# Patient Record
Sex: Male | Born: 1990 | Race: Black or African American | Hispanic: No | Marital: Single | Smoking: Never smoker
Health system: Southern US, Community
[De-identification: ages and names within clinical notes are randomized; demographics above are authoritative.]

## PROBLEM LIST (undated history)

## (undated) DIAGNOSIS — F909 Attention-deficit hyperactivity disorder, unspecified type: Secondary | ICD-10-CM

## (undated) DIAGNOSIS — M1990 Unspecified osteoarthritis, unspecified site: Secondary | ICD-10-CM

## (undated) HISTORY — PX: TIBIA FRACTURE SURGERY: SHX806

---
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
## 2010-02-09 ENCOUNTER — Emergency Department (HOSPITAL_COMMUNITY): Admission: EM | Admit: 2010-02-09 | Discharge: 2010-02-10 | Payer: Self-pay | Admitting: Emergency Medicine

## 2010-10-22 LAB — COMPREHENSIVE METABOLIC PANEL WITH GFR
ALT: 21 U/L (ref 0–53)
AST: 21 U/L (ref 0–37)
Albumin: 4.2 g/dL (ref 3.5–5.2)
Alkaline Phosphatase: 71 U/L (ref 39–117)
BUN: 11 mg/dL (ref 6–23)
CO2: 25 meq/L (ref 19–32)
Calcium: 9.5 mg/dL (ref 8.4–10.5)
Chloride: 100 meq/L (ref 96–112)
Creatinine, Ser: 1.28 mg/dL (ref 0.4–1.5)
GFR calc non Af Amer: >60 mL/min
Glucose, Bld: 117 mg/dL — ABNORMAL HIGH (ref 70–99)
Potassium: 3.3 meq/L — ABNORMAL LOW (ref 3.5–5.1)
Sodium: 136 meq/L (ref 135–145)
Total Bilirubin: 0.8 mg/dL (ref 0.3–1.2)
Total Protein: 7.6 g/dL (ref 6.0–8.3)

## 2010-10-22 LAB — DIFFERENTIAL
Basophils Absolute: 0 10*3/uL (ref 0.0–0.1)
Basophils Relative: 0 % (ref 0–1)
Eosinophils Absolute: 0 10*3/uL (ref 0.0–0.7)
Lymphocytes Relative: 3 % — ABNORMAL LOW (ref 12–46)
Lymphs Abs: 0.3 10*3/uL — ABNORMAL LOW (ref 0.7–4.0)
Neutro Abs: 8.8 10*3/uL — ABNORMAL HIGH (ref 1.7–7.7)
Neutrophils Relative %: 90 % — ABNORMAL HIGH (ref 43–77)

## 2010-10-22 LAB — CBC
HCT: 44.5 % (ref 39.0–52.0)
Hemoglobin: 15 g/dL (ref 13.0–17.0)
MCHC: 33.7 g/dL (ref 30.0–36.0)
MCV: 89.8 fL (ref 78.0–100.0)
Platelets: 200 K/uL (ref 150–400)
RBC: 4.96 MIL/uL (ref 4.22–5.81)
RDW: 12.9 % (ref 11.5–15.5)
WBC: 9.8 K/uL (ref 4.0–10.5)

## 2010-10-22 LAB — SAMPLE TO BLOOD BANK

## 2010-10-22 LAB — PROTIME-INR
INR: 1.05 (ref 0.00–1.49)
Prothrombin Time: 13.6 s (ref 11.6–15.2)

## 2010-10-22 LAB — APTT: aPTT: 30 s (ref 24–37)
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
## 2012-02-01 ENCOUNTER — Emergency Department (HOSPITAL_COMMUNITY)
Admission: EM | Admit: 2012-02-01 | Discharge: 2012-02-01 | Disposition: A | Discharge status: Home / Self Care | Payer: Self-pay

## 2012-02-01 NOTE — ED Notes (Signed)
Patient left without being seen.

## 2012-05-23 ENCOUNTER — Emergency Department (HOSPITAL_COMMUNITY)
Admission: EM | Admit: 2012-05-23 | Discharge: 2012-05-23 | Disposition: A | Discharge status: Home / Self Care | Payer: Self-pay | Attending: Emergency Medicine | Admitting: Emergency Medicine

## 2012-05-23 ENCOUNTER — Encounter (HOSPITAL_COMMUNITY): Payer: Self-pay

## 2012-05-23 DIAGNOSIS — Z760 Encounter for issue of repeat prescription: Secondary | ICD-10-CM | POA: Insufficient documentation

## 2012-05-23 DIAGNOSIS — M79609 Pain in unspecified limb: Secondary | ICD-10-CM | POA: Insufficient documentation

## 2012-05-23 DIAGNOSIS — M79606 Pain in leg, unspecified: Secondary | ICD-10-CM

## 2012-05-23 DIAGNOSIS — Z9889 Other specified postprocedural states: Secondary | ICD-10-CM | POA: Insufficient documentation

## 2012-05-23 DIAGNOSIS — M069 Rheumatoid arthritis, unspecified: Secondary | ICD-10-CM | POA: Insufficient documentation

## 2012-05-23 DIAGNOSIS — Z79899 Other long term (current) drug therapy: Secondary | ICD-10-CM | POA: Insufficient documentation

## 2012-05-23 HISTORY — DX: Unspecified osteoarthritis, unspecified site: M19.90

## 2012-05-23 MED ORDER — MELOXICAM 15 MG PO TABS: 15.0000 mg | ORAL_TABLET | Freq: Every day | ORAL | Status: DC

## 2012-05-23 MED ORDER — HYDROCODONE-ACETAMINOPHEN 5-300 MG PO TABS: 1.0000 | ORAL_TABLET | ORAL | Status: DC | PRN

## 2012-05-23 NOTE — ED Notes (Signed)
Pt presents with NAD-  Pt reports he is on vicodin for chronic pain- pt states he has been without med x 1 year.   Pt has RA has been taking other's RX for pain

## 2012-05-25 NOTE — ED Provider Notes (Signed)
Medical screening examination/treatment/procedure(s) were performed by non-physician practitioner and as supervising physician I was immediately available for consultation/collaboration.  Flint Melter, MD 05/25/12 [phone]
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
## 2012-11-29 ENCOUNTER — Emergency Department (HOSPITAL_COMMUNITY)
Admission: EM | Admit: 2012-11-29 | Discharge: 2012-11-29 | Disposition: A | Discharge status: Home / Self Care | Payer: Self-pay | Attending: Emergency Medicine | Admitting: Emergency Medicine

## 2012-11-29 ENCOUNTER — Encounter (HOSPITAL_COMMUNITY): Payer: Self-pay | Admitting: Emergency Medicine

## 2012-11-29 ENCOUNTER — Emergency Department (HOSPITAL_COMMUNITY): Payer: Self-pay

## 2012-11-29 DIAGNOSIS — S99929A Unspecified injury of unspecified foot, initial encounter: Secondary | ICD-10-CM | POA: Insufficient documentation

## 2012-11-29 DIAGNOSIS — Z8739 Personal history of other diseases of the musculoskeletal system and connective tissue: Secondary | ICD-10-CM | POA: Insufficient documentation

## 2012-11-29 DIAGNOSIS — S8992XA Unspecified injury of left lower leg, initial encounter: Secondary | ICD-10-CM

## 2012-11-29 DIAGNOSIS — S8990XA Unspecified injury of unspecified lower leg, initial encounter: Secondary | ICD-10-CM | POA: Insufficient documentation

## 2012-11-29 HISTORY — DX: Attention-deficit hyperactivity disorder, unspecified type: F90.9

## 2012-11-29 MED ORDER — HYDROCODONE-ACETAMINOPHEN 5-325 MG PO TABS: 1.0000 | ORAL_TABLET | Freq: Four times a day (QID) | ORAL | Status: DC | PRN

## 2012-11-29 MED ORDER — IBUPROFEN 800 MG PO TABS: 800.0000 mg | ORAL_TABLET | Freq: Three times a day (TID) | ORAL | Status: DC | PRN
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
## 2012-11-29 NOTE — ED Provider Notes (Signed)
Medical screening examination/treatment/procedure(s) were performed by non-physician practitioner and as supervising physician I was immediately available for consultation/collaboration.   Jaryah Aracena Y. Landen Knoedler, MD 11/29/12 2338 

## 2012-12-20 ENCOUNTER — Emergency Department (HOSPITAL_COMMUNITY)
Admission: EM | Admit: 2012-12-20 | Discharge: 2012-12-20 | Disposition: A | Discharge status: Home / Self Care | Payer: Self-pay | Attending: Emergency Medicine | Admitting: Emergency Medicine

## 2012-12-20 ENCOUNTER — Encounter (HOSPITAL_COMMUNITY): Payer: Self-pay | Admitting: *Deleted

## 2012-12-20 DIAGNOSIS — Y929 Unspecified place or not applicable: Secondary | ICD-10-CM | POA: Insufficient documentation

## 2012-12-20 DIAGNOSIS — M62838 Other muscle spasm: Secondary | ICD-10-CM | POA: Insufficient documentation

## 2012-12-20 DIAGNOSIS — M129 Arthropathy, unspecified: Secondary | ICD-10-CM | POA: Insufficient documentation

## 2012-12-20 DIAGNOSIS — W11XXXA Fall on and from ladder, initial encounter: Secondary | ICD-10-CM | POA: Insufficient documentation

## 2012-12-20 DIAGNOSIS — F909 Attention-deficit hyperactivity disorder, unspecified type: Secondary | ICD-10-CM | POA: Insufficient documentation

## 2012-12-20 DIAGNOSIS — Y990 Civilian activity done for income or pay: Secondary | ICD-10-CM | POA: Insufficient documentation

## 2012-12-20 DIAGNOSIS — W19XXXA Unspecified fall, initial encounter: Secondary | ICD-10-CM

## 2012-12-20 DIAGNOSIS — M62830 Muscle spasm of back: Secondary | ICD-10-CM

## 2012-12-20 MED ORDER — DIAZEPAM 5 MG PO TABS
5.0000 mg | ORAL_TABLET | Freq: Once | ORAL | Status: AC
  Administered 2012-12-20: 5 mg via ORAL
  Filled 2012-12-20: qty 1

## 2012-12-20 MED ORDER — TRAMADOL HCL 50 MG PO TABS
50.0000 mg | ORAL_TABLET | Freq: Once | ORAL | Status: AC
  Administered 2012-12-20: 50 mg via ORAL
  Filled 2012-12-20: qty 1

## 2012-12-20 MED ORDER — DIAZEPAM 5 MG PO TABS: 5.0000 mg | ORAL_TABLET | Freq: Three times a day (TID) | ORAL | Status: DC | PRN

## 2012-12-20 MED ORDER — TRAMADOL HCL 50 MG PO TABS: 50.0000 mg | ORAL_TABLET | Freq: Once | ORAL | Status: DC

## 2012-12-20 NOTE — ED Notes (Signed)
Pt in c/o lower back pain, states he fell at work today and landed on his back, increased pain with movement. History of back pain in same area.

## 2012-12-20 NOTE — ED Provider Notes (Signed)
History     CSN: [id number]  Arrival date & time 12/20/12  0241   First MD Initiated Contact with Patient 12/20/12 0256      Chief Complaint  Patient presents with  . Back Pain    (Consider location/radiation/quality/duration/timing/severity/associated sxs/prior treatment) HPI Comments: Patient states he fell off a ladder today at work, approximately 8 PM, landing on his back.  Has not taken any medication.  Prior to arrival.  Denies nausea, vomiting, constipation, diarrhea, dysuria, hesitancy.  He did not hit his head  Patient is a 22 y.o. male presenting with back pain. The history is provided by the patient.  Back Pain Location:  Thoracic spine Quality:  Aching Radiates to:  Does not radiate Pain severity:  Moderate Pain is:  Same all the time Onset quality:  Sudden Duration:  6 hours Timing:  Constant Chronicity:  Recurrent Relieved by:  None tried Worsened by:  Movement Ineffective treatments:  None tried Associated symptoms: no abdominal pain, no abdominal swelling, no bladder incontinence, no bowel incontinence, no numbness and no weakness     Past Medical History  Diagnosis Date  . Arthritis   . ADHD (attention deficit hyperactivity disorder)     Past Surgical History  Procedure Laterality Date  . Tibia fracture surgery      Family History  Problem Relation Age of Onset  . Hypertension Mother     History  Substance Use Topics  . Smoking status: Never Smoker   . Smokeless tobacco: Not on file  . Alcohol Use: Yes      Review of Systems  Constitutional: Negative for activity change.  Gastrointestinal: Negative for nausea, abdominal pain and bowel incontinence.  Genitourinary: Negative for bladder incontinence.  Musculoskeletal: Positive for back pain.  Neurological: Negative for weakness and numbness.  All other systems reviewed and are negative.    Allergies  Aspirin  Home Medications   Current Outpatient Rx  Name  Route  Sig  Dispense   Refill  . diazepam (VALIUM) 5 MG tablet   Oral   Take 1 tablet (5 mg total) by mouth every 8 (eight) hours as needed for anxiety.   18 tablet   0   . traMADol (ULTRAM) 50 MG tablet   Oral   Take 1 tablet (50 mg total) by mouth once.   30 tablet   0     BP 142/83  Pulse 98  Temp(Src) 98.2 F (36.8 C) (Oral)  Resp 20  Ht 5\' 8"  (1.727 m)  Wt 185 lb (83.915 kg)  BMI 28.14 kg/m2  SpO2 100%  Physical Exam  Nursing note and vitals reviewed. Constitutional: He appears well-developed and well-nourished.  HENT:  Head: Normocephalic and atraumatic.  Eyes: Pupils are equal, round, and reactive to light.  Neck: Normal range of motion.  Cardiovascular: Normal rate and regular rhythm.   Pulmonary/Chest: Effort normal and breath sounds normal.  Musculoskeletal: He exhibits tenderness. He exhibits no edema.       Back:  Neurological: He is alert.    ED Course  Procedures (including critical care time)  Labs Reviewed - No data to display No results found.   1. Fall, initial encounter   2. Muscle spasm of back       MDM  I fell his is mostly muscle spasm will treat with Valium and Ultram and refer patient to his his occupational health physician         Arman Filter, NP 12/20/12 2002

## 2012-12-27 NOTE — ED Provider Notes (Signed)
Medical screening examination/treatment/procedure(s) were performed by non-physician practitioner and as supervising physician I was immediately available for consultation/collaboration.  Quade Ramirez R. Ahlam Piscitelli, MD 12/27/12 0721 

## 2013-02-18 ENCOUNTER — Emergency Department (HOSPITAL_COMMUNITY)
Admission: EM | Admit: 2013-02-18 | Discharge: 2013-02-18 | Disposition: A | Discharge status: Home / Self Care | Payer: Self-pay | Attending: Emergency Medicine | Admitting: Emergency Medicine

## 2013-02-18 ENCOUNTER — Encounter (HOSPITAL_COMMUNITY): Payer: Self-pay | Admitting: *Deleted

## 2013-02-18 DIAGNOSIS — R369 Urethral discharge, unspecified: Secondary | ICD-10-CM | POA: Insufficient documentation

## 2013-02-18 DIAGNOSIS — Z711 Person with feared health complaint in whom no diagnosis is made: Secondary | ICD-10-CM

## 2013-02-18 DIAGNOSIS — Z8739 Personal history of other diseases of the musculoskeletal system and connective tissue: Secondary | ICD-10-CM | POA: Insufficient documentation

## 2013-02-18 DIAGNOSIS — Z8659 Personal history of other mental and behavioral disorders: Secondary | ICD-10-CM | POA: Insufficient documentation

## 2013-02-18 DIAGNOSIS — Z202 Contact with and (suspected) exposure to infections with a predominantly sexual mode of transmission: Secondary | ICD-10-CM | POA: Insufficient documentation

## 2013-02-18 DIAGNOSIS — R3989 Other symptoms and signs involving the genitourinary system: Secondary | ICD-10-CM | POA: Insufficient documentation

## 2013-02-18 LAB — URINE MICROSCOPIC-ADD ON

## 2013-02-18 LAB — URINALYSIS, ROUTINE W REFLEX MICROSCOPIC
Bilirubin Urine: NEGATIVE
Protein, ur: NEGATIVE mg/dL
Specific Gravity, Urine: 1.02 (ref 1.005–1.030)
Urobilinogen, UA: 0.2 mg/dL (ref 0.0–1.0)

## 2013-02-18 MED ORDER — CEFTRIAXONE SODIUM 250 MG IJ SOLR
250.0000 mg | Freq: Once | INTRAMUSCULAR | Status: AC
  Administered 2013-02-18: 250 mg via INTRAMUSCULAR
  Filled 2013-02-18: qty 250

## 2013-02-18 MED ORDER — AZITHROMYCIN 250 MG PO TABS
1000.0000 mg | ORAL_TABLET | Freq: Once | ORAL | Status: AC
  Administered 2013-02-18: 1000 mg via ORAL
  Filled 2013-02-18: qty 4

## 2013-02-18 NOTE — ED Notes (Signed)
Pt states he's having yellow discharge from penis and burning w/ urination, pt denies odor, denies rash/redness.

## 2013-02-18 NOTE — ED Provider Notes (Signed)
Medical screening examination/treatment/procedure(s) were performed by non-physician practitioner and as supervising physician I was immediately available for consultation/collaboration.  Ethelda Chick, MD 02/18/13 [phone]

## 2013-02-18 NOTE — ED Provider Notes (Signed)
CSN: [id number]     Arrival date & time 02/18/13  1632 History     First MD Initiated Contact with Patient 02/18/13 1639     Chief Complaint  Patient presents with  . Penile Discharge  . burning w/ urination    (Consider location/radiation/quality/duration/timing/severity/associated sxs/prior Treatment) Patient is a 22 y.o. male presenting with penile discharge. The history is provided by the patient and medical records. No language interpreter was used.  Penile Discharge Pertinent negatives include no abdominal pain, chest pain, coughing, diaphoresis, fatigue, fever, headaches, nausea, rash or vomiting.    Anthony Michael is a 22 y.o. male  with a hx of arthritis for which he takes Norco presents to the Emergency Department complaining of gradual, persistent, progressively worsening penile discharge for 3 days with associated burning with urination.  Pt reports that the discharge is yellow in color and consistently dripping.  Pt states he is not currently sexually active and his last partner was a male and was 2 months ago.  Pt reports giving anal sex. Pt has not taken any OTC medications.  Nothing makes it better and nothing makes it worse.  Pt denies fever, chills, headache, neck pain, chest pain or shortness of breath, abdominal pain, nausea, vomiting, diarrhea and weakness, dizziness, syncope. He specifically denies penile lesions, penile pain, testicular pain, testicular swelling or hematuria.     Past Medical History  Diagnosis Date  . Arthritis   . ADHD (attention deficit hyperactivity disorder)    Past Surgical History  Procedure Laterality Date  . Tibia fracture surgery     Family History  Problem Relation Age of Onset  . Hypertension Mother    History  Substance Use Topics  . Smoking status: Never Smoker   . Smokeless tobacco: Never Used  . Alcohol Use: Yes    Review of Systems  Constitutional: Negative for fever, diaphoresis, appetite change, fatigue and unexpected  weight change.  HENT: Negative for mouth sores and neck stiffness.   Eyes: Negative for visual disturbance.  Respiratory: Negative for cough, chest tightness, shortness of breath and wheezing.   Cardiovascular: Negative for chest pain.  Gastrointestinal: Negative for nausea, vomiting, abdominal pain, diarrhea and constipation.  Endocrine: Negative for polydipsia, polyphagia and polyuria.  Genitourinary: Positive for discharge. Negative for dysuria, urgency, frequency and hematuria.  Musculoskeletal: Negative for back pain.  Skin: Negative for rash.  Allergic/Immunologic: Negative for immunocompromised state.  Neurological: Negative for syncope, light-headedness and headaches.  Hematological: Does not bruise/bleed easily.  Psychiatric/Behavioral: Negative for sleep disturbance. The patient is not nervous/anxious.     Allergies  Aspirin  Home Medications   Current Outpatient Rx  Name  Route  Sig  Dispense  Refill  . HYDROcodone-acetaminophen (NORCO/VICODIN) 5-325 MG per tablet   Oral   Take 1 tablet by mouth every 6 (six) hours as needed for pain.          BP 132/74  Pulse 85  Temp(Src) 98.1 F (36.7 C) (Oral)  Resp 20  SpO2 100% Physical Exam  Nursing note and vitals reviewed. Constitutional: He is oriented to person, place, and time. He appears well-developed and well-nourished. No distress.  Awake, alert, nontoxic appearance  HENT:  Head: Normocephalic and atraumatic.  Mouth/Throat: Oropharynx is clear and moist. No oropharyngeal exudate.  Eyes: Conjunctivae are normal. No scleral icterus.  Neck: Normal range of motion. Neck supple.  Cardiovascular: Normal rate, regular rhythm, normal heart sounds and intact distal pulses.   No murmur heard. Pulmonary/Chest: Effort normal and  breath sounds normal. No respiratory distress. He has no wheezes.  Abdominal: Soft. Bowel sounds are normal. He exhibits no mass. There is no tenderness. There is no rebound and no guarding.  Hernia confirmed negative in the right inguinal area and confirmed negative in the left inguinal area.  Genitourinary: Testes normal. Right testis shows no mass, no swelling and no tenderness. Right testis is descended. Left testis shows no mass, no swelling and no tenderness. Left testis is descended. Circumcised. No phimosis, paraphimosis, hypospadias, penile erythema or penile tenderness. Discharge (thick, yellow, copious and dripping) found.  No lesions or chancre  Musculoskeletal: Normal range of motion. He exhibits no edema.  Lymphadenopathy:       Right: No inguinal adenopathy present.       Left: No inguinal adenopathy present.  Neurological: He is alert and oriented to person, place, and time. He exhibits normal muscle tone. Coordination normal.  Speech is clear and goal oriented Moves extremities without ataxia  Skin: Skin is warm and dry. He is not diaphoretic. No erythema.  Psychiatric: He has a normal mood and affect.    ED Course   Procedures (including critical care time)  Labs Reviewed  URINALYSIS, ROUTINE W REFLEX MICROSCOPIC - Abnormal; Notable for the following:    APPearance CLOUDY (*)    Leukocytes, UA LARGE (*)    All other components within normal limits  GC/CHLAMYDIA PROBE AMP  URINE CULTURE  URINE MICROSCOPIC-ADD ON   No results found. 1. Concern about STD in male without diagnosis   2. Penile discharge     MDM  Anthony Michael  presents for STD screen and penile discharge.  STD cultures obtained including gonorrhea Chlamydia. UA with large leukocytes and WBC's TNTC but only rare bacteria; more consistent with STD than UTI. Patient treated here in the emergency department with Rocephin and azithromycin. Patient to be discharged with instructions to follow up with PCP. Discussed importance of using protection when sexually active. Pt understands that they have GC/Chlamydia cultures pending and that they will need to inform all sexual partners if results return  positive. I have also discussed reasons to return immediately to the ER. Patient expresses understanding and agrees with plan.    Dahlia Client Vernis Cabacungan, PA-C 02/18/13 1807

## 2013-02-19 LAB — URINE CULTURE: Culture: NO GROWTH

## 2013-11-07 ENCOUNTER — Encounter (HOSPITAL_COMMUNITY): Payer: Self-pay | Admitting: Emergency Medicine

## 2013-11-07 ENCOUNTER — Emergency Department (HOSPITAL_COMMUNITY)
Admission: EM | Admit: 2013-11-07 | Discharge: 2013-11-08 | Discharge status: Against Medical Advice | Payer: Self-pay | Attending: Emergency Medicine | Admitting: Emergency Medicine

## 2013-11-07 DIAGNOSIS — M79609 Pain in unspecified limb: Secondary | ICD-10-CM | POA: Insufficient documentation
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
## 2013-11-08 NOTE — ED Notes (Signed)
No answer when pt's name called in the waiting room or the area immediately outside; unable to locate pt.

## 2013-11-08 NOTE — ED Notes (Signed)
No answer when pt's name called in waiting room x 3

## 2013-11-08 NOTE — ED Notes (Signed)
No answered when pt's name called in waiting room and immediate area outside; unable to locate pt.

## 2013-11-15 ENCOUNTER — Encounter (HOSPITAL_COMMUNITY): Payer: Self-pay | Admitting: Emergency Medicine

## 2013-11-15 ENCOUNTER — Emergency Department (HOSPITAL_COMMUNITY)
Admission: EM | Admit: 2013-11-15 | Discharge: 2013-11-15 | Disposition: A | Discharge status: Home / Self Care | Payer: Self-pay | Attending: Emergency Medicine | Admitting: Emergency Medicine

## 2013-11-15 DIAGNOSIS — Z79899 Other long term (current) drug therapy: Secondary | ICD-10-CM | POA: Insufficient documentation

## 2013-11-15 DIAGNOSIS — M129 Arthropathy, unspecified: Secondary | ICD-10-CM | POA: Insufficient documentation

## 2013-11-15 DIAGNOSIS — Z8659 Personal history of other mental and behavioral disorders: Secondary | ICD-10-CM | POA: Insufficient documentation

## 2013-11-15 DIAGNOSIS — B353 Tinea pedis: Secondary | ICD-10-CM | POA: Insufficient documentation

## 2013-11-15 MED ORDER — TERBINAFINE HCL 1 % EX CREA: 1.0000 "application " | TOPICAL_CREAM | Freq: Two times a day (BID) | CUTANEOUS | Status: AC

## 2013-11-15 NOTE — ED Provider Notes (Signed)
CSN: [id number]     Arrival date & time 11/15/13  0111 History   First MD Initiated Contact with Patient 11/15/13 0217     Chief Complaint  Patient presents with  . Foot Pain     (Consider location/radiation/quality/duration/timing/severity/associated sxs/prior Treatment) HPI  23 year old male with history of arthritis and ADHD who presents complaining of left foot pain. Patient reports a week ago he went to have a pedicure in the next day he notice white skin changes in between the toes of his left foot. Report itching and burning sensation in between his toes and symptom is getting progressively worse. He tried taking Advil and try scratching at it with minimal relief. He has never had this problem before. He denies fever, chills, joint pain, or other rash. He denies history of HIV. He has no other complaints.  Past Medical History  Diagnosis Date  . Arthritis   . ADHD (attention deficit hyperactivity disorder)    Past Surgical History  Procedure Laterality Date  . Tibia fracture surgery     Family History  Problem Relation Age of Onset  . Hypertension Mother    History  Substance Use Topics  . Smoking status: Never Smoker   . Smokeless tobacco: Never Used  . Alcohol Use: Yes     Comment: occ    Review of Systems  Constitutional: Negative for fever.  Skin: Positive for rash.      Allergies  Aspirin  Home Medications   Prior to Admission medications   Medication Sig Start Date End Date Taking? Authorizing Provider  HYDROcodone-acetaminophen (NORCO/VICODIN) 5-325 MG per tablet Take 1 tablet by mouth every 6 (six) hours as needed for pain.    Historical Provider, MD   BP 126/69  Pulse 66  Temp(Src) 98.1 F (36.7 C) (Oral)  Resp 18  Ht 5\' 8"  (1.727 m)  SpO2 100% Physical Exam  Constitutional: He appears well-developed and well-nourished. No distress.  HENT:  Head: Atraumatic.  Eyes: Conjunctivae are normal.  Neck: Normal range of motion. Neck supple.   Neurological: He is alert.  Skin:  L foot: evidence of tinea pedis (macerated skin in between web of toes).  No signs of cellulitis.  No joint involvement.  Dorsalis pedis palpable.  No focal point tenderness to L foot or L ankle    Psychiatric: He has a normal mood and affect.    ED Course  Procedures (including critical care time)  2:32 AM Pt with evidence of tinea pedis.  Recommend Lamisil and good foot hygiene.    Labs Review Labs Reviewed - No data to display  Imaging Review No results found.   EKG Interpretation None      MDM   Final diagnoses:  Tinea pedis    BP 126/69  Pulse 66  Temp(Src) 98.1 F (36.7 C) (Oral)  Resp 18  Ht 5\' 8"  (1.727 m)  SpO2 100%     Domenic Moras, PA-C 11/15/13 0235

## 2013-11-15 NOTE — ED Notes (Signed)
Per pt report: pt c/o of left foot pain that began last week.  Pt reports white fungus like stuff in between toes.  Pt reports taking advil earlier today without relief.  Pt able to bear weight on foot. Pt a/o x 4.  Skiw warm and dry.

## 2013-11-15 NOTE — ED Notes (Signed)
Patient with white, yeast like area to left foot/toes. Patient ambulatory without difficulty.

## 2013-11-15 NOTE — Discharge Instructions (Signed)
Athlete's Foot Tinea Pedis, more commonly know as athlete's foot, is a contagious fungal skin infection of the feet. Athlete's foot usually affects the skin between the fourth and fifth toes. It is called athlete's foot because it is a common occurrence in athletes. SYMPTOMS   Scales on the feet, most commonly between the toes, that appear moist, soft, gray-white, or red.  Dead skin between the toes.  Itching in the inflamed areas.  Damp, musty foot odor.  Sometimes, small blisters on the feet, caused by a hypersensitivity to the fungus. CAUSES  Infection is caused by contact with a fungus or yeast. The fungus or yeast typically reside in moist socks and shoes, because the fungus thrives in dark, moist environments. RISK INCREASES WITH:  Walking barefoot public places such as bathrooms or showers  Poor hygiene of the feet: infrequent washing of the feet and/or infrequent changing of shoes or socks.  Hot, humid weather.  Forgetting to dry the spaces between your toes. PREVENTION  Follow good locker room hygiene.  Use your own towels.  Wear shoes or sandals.  Wash with warm or hot water and soap.  Wash your feet daily. Dry thoroughly, especially between the toes. Dust with talcum powder or antifungal powder.  Allow feet to dry out by occasionally walking barefoot.  Change socks daily.  Wear socks made of cotton, wool, or other natural absorbent fibers. Avoid socks made from synthetic fibers. PROGNOSIS  With appropriate treatment athlete's foot typically may resolve within 3 weeks, although, recurrence is common.  RELATED COMPLICATIONS   Chronic infection or recurrence, especially if not appropriately or completely treated.  Bacterial infection on top of the fungal infection in the affected area (bacterial superinfection or secondary bacterial infection).  Rarely, an allergic autoimmune response to the infection on the hands and face. TREATMENT Initially, keep the  affected area dry and cool. Remove the scaly and dead material if it is present. Change socks daily. Walking barefoot or wearing sandals whenever possible helps dry the affected area. Use antifungal powders, creams, or ointments after each bath. If the infection does not respond to topical treatment, contact your caregiver and he or she may prescribe oral antifungal medication. MEDICATION   Non-prescription antifungal creams, ointments, or powders can be used on your feet or toes or in shoes.  Anti-itch medications may be prescribed as necessary by your caregiver. Use only as directed and only as much as you need.  Oral antifungal medications may be prescribed by your caregiver. Take the entire course of medication as prescribed. SEEK MEDICAL CARE IF:   Symptoms get worse or do not improve in 2 weeks despite treatment.  New, unexplained symptoms develop (drugs used in treatment may produce side effects). Document Released: 07/07/2005 Document Revised: 09/29/2011 Document Reviewed: 10/19/2008 [facility] Patient Information 2014 [place], [place].

## 2013-11-15 NOTE — ED Provider Notes (Signed)
Medical screening examination/treatment/procedure(s) were performed by non-physician practitioner and as supervising physician I was immediately available for consultation/collaboration.    Victoriya Pol D Abigael Mogle, MD 11/15/13 0615 

## 2014-06-16 ENCOUNTER — Emergency Department (HOSPITAL_COMMUNITY)
Admission: EM | Admit: 2014-06-16 | Discharge: 2014-06-16 | Disposition: A | Discharge status: Home / Self Care | Payer: Self-pay | Attending: Emergency Medicine | Admitting: Emergency Medicine

## 2014-06-16 ENCOUNTER — Emergency Department (HOSPITAL_COMMUNITY): Payer: Self-pay

## 2014-06-16 ENCOUNTER — Encounter (HOSPITAL_COMMUNITY): Payer: Self-pay | Admitting: Emergency Medicine

## 2014-06-16 DIAGNOSIS — Z8781 Personal history of (healed) traumatic fracture: Secondary | ICD-10-CM | POA: Insufficient documentation

## 2014-06-16 DIAGNOSIS — Y92838 Other recreation area as the place of occurrence of the external cause: Secondary | ICD-10-CM | POA: Insufficient documentation

## 2014-06-16 DIAGNOSIS — X58XXXA Exposure to other specified factors, initial encounter: Secondary | ICD-10-CM | POA: Insufficient documentation

## 2014-06-16 DIAGNOSIS — S8992XA Unspecified injury of left lower leg, initial encounter: Secondary | ICD-10-CM | POA: Insufficient documentation

## 2014-06-16 DIAGNOSIS — Y9389 Activity, other specified: Secondary | ICD-10-CM | POA: Insufficient documentation

## 2014-06-16 DIAGNOSIS — Y998 Other external cause status: Secondary | ICD-10-CM | POA: Insufficient documentation

## 2014-06-16 DIAGNOSIS — Z8659 Personal history of other mental and behavioral disorders: Secondary | ICD-10-CM | POA: Insufficient documentation

## 2014-06-16 DIAGNOSIS — M25562 Pain in left knee: Secondary | ICD-10-CM

## 2014-06-16 DIAGNOSIS — Z880 Allergy status to penicillin: Secondary | ICD-10-CM | POA: Insufficient documentation

## 2014-06-16 MED ORDER — HYDROCODONE-ACETAMINOPHEN 5-325 MG PO TABS: ORAL_TABLET | ORAL | Status: DC

## 2014-06-16 MED ORDER — HYDROCODONE-ACETAMINOPHEN 5-325 MG PO TABS
1.0000 | ORAL_TABLET | Freq: Once | ORAL | Status: AC
  Administered 2014-06-16: 1 via ORAL
  Filled 2014-06-16: qty 1

## 2014-06-16 NOTE — ED Notes (Signed)
Pt was doing swats at gym today and felt knee and hip pop. Pt has not been able to walk on leg since then. Was assisted by friend. No deformity noted.

## 2014-06-16 NOTE — ED Notes (Signed)
Pt transported to radiology via wheelchair with [facility]

## 2014-06-16 NOTE — ED Provider Notes (Signed)
CSN: [id number]     Arrival date & time 06/16/14  1827 History  This chart was scribed for non-physician practitioner, Monico Blitz, PA-C,working with Quintella Reichert, MD, by Marlowe Kays, ED Scribe. This patient was seen in room WTR6/WTR6 and the patient's care was started at 7:03 PM.  Chief Complaint  Patient presents with  . Knee Pain  . Hip Pain   The history is provided by the patient. No language interpreter was used.    HPI Comments:  Anthony Michael is a 23 y.o. male with PMH of left tibia fracture and arthritis who presents to the Emergency Department complaining of gradually worsening left knee pain that began two days ago. He reports when he was at the gym earlier today doing squats he felt something "popping". He reports the pain radiates up into his left thigh and hip. He denies doing anything or taking anything for the pain. He states he cannot bear weight since feeling the popping. Denies modifying factors. Denies numbness, tingling or weakness of the leg, redness or warmth of the left knee. Allergy to ASA. PMH of ADHD.  Past Medical History  Diagnosis Date  . Arthritis   . ADHD (attention deficit hyperactivity disorder)    Past Surgical History  Procedure Laterality Date  . Tibia fracture surgery     Family History  Problem Relation Age of Onset  . Hypertension Mother    History  Substance Use Topics  . Smoking status: Never Smoker   . Smokeless tobacco: Never Used  . Alcohol Use: Yes     Comment: occ    Review of Systems  Musculoskeletal: Positive for arthralgias.  Skin: Negative for color change and wound.  Neurological: Negative for weakness and numbness.  All other systems reviewed and are negative.   Allergies  Aspirin and Penicillins  Home Medications   Prior to Admission medications   Medication Sig Start Date End Date Taking? Authorizing Provider  HYDROcodone-acetaminophen (NORCO/VICODIN) 5-325 MG per tablet Take 1-2 tablets by mouth every  6 hours as needed for pain. 06/16/14   Naziyah Tieszen, PA-C   Triage Vitals: BP 122/70 mmHg  Pulse 88  Temp(Src) 98.4 F (36.9 C) (Oral)  Resp 16  SpO2 98% Physical Exam  Constitutional: He is oriented to person, place, and time. He appears well-developed and well-nourished.  HENT:  Head: Normocephalic and atraumatic.  Eyes: EOM are normal.  Neck: Normal range of motion.  Cardiovascular: Normal rate.   Pulmonary/Chest: Effort normal.  Musculoskeletal: Normal range of motion. He exhibits tenderness.  Left knee:  No deformity, erythema or abrasions. Decreased range of motion. No effusion. Significant crepitance.Marland Kitchen Anterior and posterior drawer show no abnormal laxity. Stable to valgus and varus stress. Joint lines are non-tender. Neurovascularly intact.   Left hip with no tenderness to palpation, full active range of motion.  Neurological: He is alert and oriented to person, place, and time.  Skin: Skin is warm and dry.  Psychiatric: He has a normal mood and affect. His behavior is normal.  Nursing note and vitals reviewed.   ED Course  Procedures (including critical care time) DIAGNOSTIC STUDIES: Oxygen Saturation is 98% on RA, normal by my interpretation.   COORDINATION OF CARE: 7:07 PM- Will X-Ray left knee. Pt verbalizes understanding and agrees to plan.  Medications  HYDROcodone-acetaminophen (NORCO/VICODIN) 5-325 MG per tablet 1 tablet (1 tablet Oral Given 06/16/14 1916)    Labs Review Labs Reviewed - No data to display  Imaging Review Dg Knee Complete 4 Views  Left  06/16/2014   CLINICAL DATA:  Left knee pain/swelling  EXAM: LEFT KNEE - COMPLETE 4+ VIEW  COMPARISON:  None.  FINDINGS: No fracture or dislocation is seen.  Mild degenerative changes with sharpening of the tibial spines, lateral compartment narrowing, and medial compartment osteophytosis.  No definite suprapatellar knee joint effusion.  IMPRESSION: No fracture or dislocation is seen.  Mild degenerative  changes.   Electronically Signed   By: Julian Hy M.D.   On: 06/16/2014 20:12     EKG Interpretation None      MDM   Final diagnoses:  Arthralgia of left knee    Filed Vitals:   06/16/14 1911 06/16/14 2038  BP: 122/70 122/78  Pulse: 88 88  Temp: 98.4 F (36.9 C)   TempSrc: Oral   Resp: 16 16  SpO2: 98% 99%    Medications  HYDROcodone-acetaminophen (NORCO/VICODIN) 5-325 MG per tablet 1 tablet (1 tablet Oral Given 06/16/14 1916)    Anthony Michael is a 23 y.o. male presenting with atraumatic knee pain. Physical exam was significant crepitance. X-ray with no acute abnormalities. This appears to be a chronic issue for the patient who has a history of trauma to the left tib-fib. I have advised him to follow closely with orthopedics. Patient will be given crutches and Ace wrap.  Evaluation does not show pathology that would require ongoing emergent intervention or inpatient treatment. Pt is hemodynamically stable and mentating appropriately. Discussed findings and plan with patient/guardian, who agrees with care plan. All questions answered. Return precautions discussed and outpatient follow up given.    I personally performed the services described in this documentation, which was scribed in my presence. The recorded information has been reviewed and is accurate.    Monico Blitz, PA-C 06/17/14 [place], MD 06/17/14 [id number]

## 2014-06-16 NOTE — ED Notes (Signed)
Ortho Tech contacted

## 2014-06-16 NOTE — Discharge Instructions (Signed)
Rest, Ice intermittently (in the first 24-48 hours), Gentle compression with an Ace wrap, and elevate (Limb above the level of the heart)   Take up to 800mg  of ibuprofen (that is usually 4 over the counter pills)  3 times a day for 5 days. Take with food.  Take vicodin for breakthrough pain, do not drink alcohol, drive, care for children or do other critical tasks while taking vicodin.  Do not hesitate to return to the emergency room for any new, worsening or concerning symptoms.  Please obtain primary care using resource guide below. But the minute you were seen in the emergency room and that they will need to obtain records for further outpatient management.   Knee Pain The knee is the complex joint between your thigh and your lower leg. It is made up of bones, tendons, ligaments, and cartilage. The bones that make up the knee are:  The femur in the thigh.  The tibia and fibula in the lower leg.  The patella or kneecap riding in the groove on the lower femur. CAUSES  Knee pain is a common complaint with many causes. A few of these causes are:  Injury, such as:  A ruptured ligament or tendon injury.  Torn cartilage.  Medical conditions, such as:  Gout  Arthritis  Infections  Overuse, over training, or overdoing a physical activity. Knee pain can be minor or severe. Knee pain can accompany debilitating injury. Minor knee problems often respond well to self-care measures or get well on their own. More serious injuries may need medical intervention or even surgery. SYMPTOMS The knee is complex. Symptoms of knee problems can vary widely. Some of the problems are:  Pain with movement and weight bearing.  Swelling and tenderness.  Buckling of the knee.  Inability to straighten or extend your knee.  Your knee locks and you cannot straighten it.  Warmth and redness with pain and fever.  Deformity or dislocation of the kneecap. DIAGNOSIS  Determining what is wrong may  be very straight forward such as when there is an injury. It can also be challenging because of the complexity of the knee. Tests to make a diagnosis may include:  Your caregiver taking a history and doing a physical exam.  Routine X-rays can be used to rule out other problems. X-rays will not reveal a cartilage tear. Some injuries of the knee can be diagnosed by:  Arthroscopy a surgical technique by which a small video camera is inserted through tiny incisions on the sides of the knee. This procedure is used to examine and repair internal knee joint problems. Tiny instruments can be used during arthroscopy to repair the torn knee cartilage (meniscus).  Arthrography is a radiology technique. A contrast liquid is directly injected into the knee joint. Internal structures of the knee joint then become visible on X-ray film.  An MRI scan is a non X-ray radiology procedure in which magnetic fields and a computer produce two- or three-dimensional images of the inside of the knee. Cartilage tears are often visible using an MRI scanner. MRI scans have largely replaced arthrography in diagnosing cartilage tears of the knee.  Blood work.  Examination of the fluid that helps to lubricate the knee joint (synovial fluid). This is done by taking a sample out using a needle and a syringe. TREATMENT The treatment of knee problems depends on the cause. Some of these treatments are:  Depending on the injury, proper casting, splinting, surgery, or physical therapy care will be  needed.  Give yourself adequate recovery time. Do not overuse your joints. If you begin to get sore during workout routines, back off. Slow down or do fewer repetitions.  For repetitive activities such as cycling or running, maintain your strength and nutrition.  Alternate muscle groups. For example, if you are a weight lifter, work the upper body on one day and the lower body the next.  Either tight or weak muscles do not give the  proper support for your knee. Tight or weak muscles do not absorb the stress placed on the knee joint. Keep the muscles surrounding the knee strong.  Take care of mechanical problems.  If you have flat feet, orthotics or special shoes may help. See your caregiver if you need help.  Arch supports, sometimes with wedges on the inner or outer aspect of the heel, can help. These can shift pressure away from the side of the knee most bothered by osteoarthritis.  A brace called an "unloader" brace also may be used to help ease the pressure on the most arthritic side of the knee.  If your caregiver has prescribed crutches, braces, wraps or ice, use as directed. The acronym for this is PRICE. This means protection, rest, ice, compression, and elevation.  Nonsteroidal anti-inflammatory drugs (NSAIDs), can help relieve pain. But if taken immediately after an injury, they may actually increase swelling. Take NSAIDs with food in your stomach. Stop them if you develop stomach problems. Do not take these if you have a history of ulcers, stomach pain, or bleeding from the bowel. Do not take without your caregiver's approval if you have problems with fluid retention, heart failure, or kidney problems.  For ongoing knee problems, physical therapy may be helpful.  Glucosamine and chondroitin are over-the-counter dietary supplements. Both may help relieve the pain of osteoarthritis in the knee. These medicines are different from the usual anti-inflammatory drugs. Glucosamine may decrease the rate of cartilage destruction.  Injections of a corticosteroid drug into your knee joint may help reduce the symptoms of an arthritis flare-up. They may provide pain relief that lasts a few months. You may have to wait a few months between injections. The injections do have a small increased risk of infection, water retention, and elevated blood sugar levels.  Hyaluronic acid injected into damaged joints may ease pain and  provide lubrication. These injections may work by reducing inflammation. A series of shots may give relief for as long as 6 months.  Topical painkillers. Applying certain ointments to your skin may help relieve the pain and stiffness of osteoarthritis. Ask your pharmacist for suggestions. Many over the-counter products are approved for temporary relief of arthritis pain.  In some countries, doctors often prescribe topical NSAIDs for relief of chronic conditions such as arthritis and tendinitis. A review of treatment with NSAID creams found that they worked as well as oral medications but without the serious side effects. PREVENTION  Maintain a healthy weight. Extra pounds put more strain on your joints.  Get strong, stay limber. Weak muscles are a common cause of knee injuries. Stretching is important. Include flexibility exercises in your workouts.  Be smart about exercise. If you have osteoarthritis, chronic knee pain or recurring injuries, you may need to change the way you exercise. This does not mean you have to stop being active. If your knees ache after jogging or playing basketball, consider switching to swimming, water aerobics, or other low-impact activities, at least for a few days a week. Sometimes limiting high-impact activities  will provide relief.  Make sure your shoes fit well. Choose footwear that is right for your sport.  Protect your knees. Use the proper gear for knee-sensitive activities. Use kneepads when playing volleyball or laying carpet. Buckle your seat belt every time you drive. Most shattered kneecaps occur in car accidents.  Rest when you are tired. SEEK MEDICAL CARE IF:  You have knee pain that is continual and does not seem to be getting better.  SEEK IMMEDIATE MEDICAL CARE IF:  Your knee joint feels hot to the touch and you have a high fever. MAKE SURE YOU:   Understand these instructions.  Will watch your condition.  Will get help right away if you are  not doing well or get worse. Document Released: 05/04/2007 Document Revised: 09/29/2011 Document Reviewed: 05/04/2007 [facility] Patient Information 2015 [place], [place]. This information is not intended to replace advice given to you by your health care provider. Make sure you discuss any questions you have with your health care provider.    Emergency Department Resource Guide 1) Find a Doctor and Pay Out of Pocket Although you won't have to find out who is covered by your insurance plan, it is a good idea to ask around and get recommendations. You will then need to call the office and see if the doctor you have chosen will accept you as a new patient and what types of options they offer for patients who are self-pay. Some doctors offer discounts or will set up payment plans for their patients who do not have insurance, but you will need to ask so you aren't surprised when you get to your appointment.  2) Contact Your Local Health Department Not all health departments have doctors that can see patients for sick visits, but many do, so it is worth a call to see if yours does. If you don't know where your local health department is, you can check in your phone book. The CDC also has a tool to help you locate your state's health department, and many state websites also have listings of all of their local health departments.  3) Find a [facility] If your illness is not likely to be very severe or complicated, you may want to try a walk in clinic. These are popping up all over the country in pharmacies, drugstores, and shopping centers. They're usually staffed by nurse practitioners or physician assistants that have been trained to treat common illnesses and complaints. They're usually fairly quick and inexpensive. However, if you have serious medical issues or chronic medical problems, these are probably not your best option.  No Primary Care Doctor: - Call Health Connect at  [phone] - they can help  you locate a primary care doctor that  accepts your insurance, provides certain services, etc. - Physician Referral Service- [phone]  Chronic Pain Problems: Organization         Address  Phone   Notes  [facility]  [phone] Patients need to be referred by their primary care doctor.   Medication Assistance: Organization         Address  Phone   Notes  [facility] Medication [facility] [address] [place], [place] [address] [phone] --Must be a resident of [facility] -- Must have NO insurance coverage whatsoever (no Medicaid/ Medicare, etc.) -- The pt. MUST have a primary care doctor that directs their care regularly and follows them in the community   MedAssist  [phone]  [employer]  [phone]    Agencies that provide inexpensive medical care: Organization         Address  Phone   Notes  [place]  [phone]   [facility]    [phone]   [facility] [place], [place] [address] [phone]   [place] [address], [place] [phone]   Planned Parenthood    [phone]   [facility]    [phone]   [place] and [place] [address], [place] Phone:  [phone], Fax:  [phone] Hours of Operation:  9 am - 6 pm, M-F.  Also accepts Medicaid/Medicare and self-pay.  [facility] for [place] [address], [address], [place] Phone: [phone], Fax: [phone]. Hours of Operation:  8:30 am - 5:30 pm, M-F.  Also accepts Medicaid and self-pay.  [facility] [place] [address] [place] Phone: [phone]   [place], [place], [place] [phone], Ext. [phone] Mondays & Thursdays: 7-9 AM.  First 15 patients are seen on a first come, first serve basis.    [place] Providers:  Organization         Address  Phone   Notes  [facility] [address], [place] [phone] Also accepts self-pay patients.  [facility] [id number] [place], [place]  [phone]   [place], [address], [place] [phone]   [facility] [address] [place] [phone]   Lucianne Lei [address], [place]   [phone] Only accepts [place] Access [place] patients after they have their name applied to their card.   Self-Pay (no insurance) in [facility]:  Organization         Address  Phone   Notes  Sickle Cell Patients, [facility] Internal [facility] [place] [phone]   [facility] Urgent Care [place] [phone]   [facility] [place]  [place], [place], [place] [phone]   [facility]/Dr. Osei-Bonsu  [address] [place] or [address], [place] [phone] Phone number for both [place] and [place] locations is the same.  [facility] [address] [place] [phone]   [facility] [address], [place] or [address] [phone]   [facility] [address], [place] [phone], phone; [phone], fax Sees patients 1st and 3rd Saturday of every month.  Must not qualify for public or private insurance (i.e. Medicaid, Medicare, [place] Health Choice, Veterans' Benefits)  Household income should be no more than 200% of the poverty level The clinic cannot treat you if you are pregnant or think you are pregnant  Sexually transmitted diseases are not treated at the clinic.    Dental Care: Organization         Address  Phone  Notes  [facility] Department of [facility] [place] [phone] Accepts children up to age 57 who are enrolled in [place] or [place]; pregnant women with a Medicaid card;  and children who have applied for Medicaid or [place] Health Choice, but were declined, whose parents can pay a reduced fee at time of service.  [facility] Department of [facility]  [address], [place] [phone] Accepts children up to age 17 who are enrolled in [place] or [place]; pregnant women with a Medicaid card; and children who have applied for Medicaid or [place] Health Choice, but were declined, whose parents can pay a reduced fee at time of service.  [place] Adult Dental Access PROGRAM  [place] [phone] Patients are seen by appointment only. Walk-ins are not accepted. [place] will see patients 37 years of age and older. Monday - Tuesday (8am-5pm) Most Wednesdays (8:30-5pm) $30 per visit, cash only  [facility] Adult Dental Access PROGRAM  [address], [facility] [phone] Patients are seen by appointment only. Walk-ins are not accepted. Joes will see patients 8 years of age and older. One Wednesday Evening (Monthly: Volunteer Based).  $30 per visit, cash only  [place]  [phone] for adults; Children under age 71, call Graduate Pediatric Dentistry at [phone]. Children aged 24-14, please call [phone] to request a pediatric application.  Dental services are provided in all areas of dental care including fillings, crowns and bridges, complete and partial dentures, implants, gum treatment, root canals, and extractions. Preventive care is also provided. Treatment is provided to both adults and children. Patients are selected via a lottery and there is often a waiting list.   [facility] [address] [place]  [phone] [email or web address]   [facility] [address] [place], [place] [phone], Ext. [phone] Second and Fourth Thursday of each month, opens at 6:30 AM; Clinic ends at 9 AM.  Patients are seen on a first-come first-served basis, and a limited number are seen during each clinic.   [facility]  [address] Hillard Danker [place], [place] [phone]   Eligibility Requirements You must have lived in [place], [place], or [place] counties for at least the last three months.   You cannot be eligible for state or federal sponsored [employer], including [employer], [place], or [employer].   You generally cannot be eligible for healthcare insurance through your employer.    How to apply: Eligibility screenings are held every Tuesday and Wednesday afternoon from 1:00 pm until 4:00 pm. You do not need an appointment for the interview!  [facility] [address], [place], [place]   [place]  [place] Department  [place]  [phone]    Behavioral Health Resources in the Community: Intensive Outpatient Programs Organization         Address  Phone  Notes  [place] [place]. [address], [place], [place] [phone]   [facility] Outpatient [address], [place], [place]   ADS: Alcohol & Drug Svcs [address], [place], [place]   [place] [address]  [place], [place] or [phone]   Substance Abuse Resources Organization         Address  Phone  Notes  Alcohol and Drug Services  [phone]   [facility]  [phone]   [facility] [place]  [phone]   Residential &  Outpatient Substance Abuse Program  [phone]   Psychological Services Organization         Address  Phone  Notes  [facility]  [place]  [phone]   [place] [address], [place] or [phone]    Mobile Crisis Teams Organization         Address  Phone  Notes  [facility], Mobile Crisis Care Unit  [phone]   [facility]  [address] [place], [place]   [place] Levels [address], [place] [phone]    Self-Help/Support Groups Organization         Address  Phone             Notes  [place]. of [place] - variety of support groups  [place] Call for more information  Narcotics Anonymous (NA), [facility]aring [facility] [address], [employer] [place]  2 meetings at this location   Special educational needs teacher         Address  Phone  Notes  [facility] Residential Treatment [place],    [place]  [phone]   [facility]  [address] [place] [id number], [place], [place]   [place], [place] [phone] Admissions: 8am-3pm M-F  Incentives Substance [place] [address],    [place], [place] [phone]   [facility] [address] [place], [place], [place]   [facility] [address]  [place], [place]   [facility] - Intensive Outpatient [address] Kristeen [address], [place], [place]   [facility] [place].,  [place], [place] [phone] or [phone]   [facility] ([facility]) [address] [place], [place] Accepts Medicaid  [facility] [place] [address]  [place] [phone] Substance Abuse/Addiction Treatment   [facility] Organization         Address  Phone  Notes  [facility]  [phone]   Domenic Schwab, PhD [address] Arlis Porta [place], [place]   [phone] or  [phone]   [place], [place] [phone]   [facility] [phone] [place], [place] [phone] Insurance/Medicaid/sponsorship through [facility] and Families [address], [address] [place]                                    [place], [place] [phone] [place] [address], [place] [phone]    Dr. Adele Schilder  [phone]   [facility] Dept. 1) [address] [place] 2) [place] 3)  [address], [place] [phone]  [phone]   [place] [phone] or [phone] (After Hours)

## 2015-08-18 ENCOUNTER — Emergency Department (HOSPITAL_COMMUNITY): Payer: Self-pay

## 2015-08-18 ENCOUNTER — Emergency Department (HOSPITAL_COMMUNITY)
Admission: EM | Admit: 2015-08-18 | Discharge: 2015-08-18 | Disposition: A | Discharge status: Home / Self Care | Payer: Self-pay | Attending: Emergency Medicine | Admitting: Emergency Medicine

## 2015-08-18 ENCOUNTER — Encounter (HOSPITAL_COMMUNITY): Payer: Self-pay | Admitting: Emergency Medicine

## 2015-08-18 DIAGNOSIS — Y9289 Other specified places as the place of occurrence of the external cause: Secondary | ICD-10-CM | POA: Insufficient documentation

## 2015-08-18 DIAGNOSIS — Y998 Other external cause status: Secondary | ICD-10-CM | POA: Insufficient documentation

## 2015-08-18 DIAGNOSIS — Z880 Allergy status to penicillin: Secondary | ICD-10-CM | POA: Insufficient documentation

## 2015-08-18 DIAGNOSIS — Y93A1 Activity, exercise machines primarily for cardiorespiratory conditioning: Secondary | ICD-10-CM | POA: Insufficient documentation

## 2015-08-18 DIAGNOSIS — S86912A Strain of unspecified muscle(s) and tendon(s) at lower leg level, left leg, initial encounter: Secondary | ICD-10-CM | POA: Insufficient documentation

## 2015-08-18 DIAGNOSIS — Z8659 Personal history of other mental and behavioral disorders: Secondary | ICD-10-CM | POA: Insufficient documentation

## 2015-08-18 DIAGNOSIS — W1839XA Other fall on same level, initial encounter: Secondary | ICD-10-CM | POA: Insufficient documentation

## 2015-08-18 DIAGNOSIS — M1990 Unspecified osteoarthritis, unspecified site: Secondary | ICD-10-CM | POA: Insufficient documentation

## 2015-08-18 MED ORDER — IBUPROFEN 800 MG PO TABS: 800.0000 mg | ORAL_TABLET | Freq: Three times a day (TID) | ORAL | Status: DC

## 2015-08-18 MED ORDER — HYDROCODONE-ACETAMINOPHEN 5-325 MG PO TABS: 2.0000 | ORAL_TABLET | ORAL | Status: DC | PRN

## 2015-08-18 NOTE — ED Notes (Signed)
Patient here with complaints of left knee pain after falling on treadmill. Swelling. Ice no relief.

## 2015-08-18 NOTE — Discharge Instructions (Signed)
1. Medications: Take ibuprofen as directed, take pain medication as needed - This can make you very drowsy - please do not drink or drive on this medication, continue usual home medications 2. Treatment: rest, drink plenty of fluids, ice as directed 3. Follow Up: Please follow up with the clinic listed if symptoms do not improve after one week for discussion of your diagnoses and further evaluation after today's visit; Please return to the ER for new or worsening symptoms, any additional concerns.   COLD THERAPY DIRECTIONS:  Ice or gel packs can be used to reduce both pain and swelling. Ice is the most helpful within the first 24 to 48 hours after an injury or flareup from overusing a muscle or joint.  Ice is effective, has very few side effects, and is safe for most people to use.   If you expose your skin to cold temperatures for too long or without the proper protection, you can damage your skin or nerves. Watch for signs of skin damage due to cold.   HOME CARE INSTRUCTIONS  Follow these tips to use ice and cold packs safely.  Place a dry or damp towel between the ice and skin. A damp towel will cool the skin more quickly, so you may need to shorten the time that the ice is used.  For a more rapid response, add gentle compression to the ice.  Ice for no more than 10 to 20 minutes at a time. The bonier the area you are icing, the less time it will take to get the benefits of ice.  Check your skin after 5 minutes to make sure there are no signs of a poor response to cold or skin damage.  Rest 20 minutes or more in between uses.  Once your skin is numb, you can end your treatment. You can test numbness by very lightly touching your skin. The touch should be so light that you do not see the skin dimple from the pressure of your fingertip. When using ice, most people will feel these normal sensations in this order: cold, burning, aching, and numbness.

## 2015-08-18 NOTE — ED Provider Notes (Signed)
CSN: [id number]     Arrival date & time 08/18/15  1341 History  By signing my name below, I, Anthony Michael, attest that this documentation has been prepared under the direction and in the presence of [facility], PA-C.  Electronically Signed: Hilda Michael, ED Scribe. 08/18/2015. 2:27 PM.    Chief Complaint  Patient presents with  . Knee Injury     The history is provided by the patient and medical records. No language interpreter was used.   HPI Comments: Anthony Michael is a 25 y.o. male who presents to the Emergency Department complaining of constant generalized throbbing left knee pain that is worst over patella and patellar tendons since last night. Pt reports he was at the gym and states his knee "gave out" on him while he was running on the treadmill, and pt fell and landed on his left knee. Pt reports a hx of intermittent left knee pain and similar episodes with bouts of exercise as well. He had surgery for a tibia fracture with surgical repair in 2008 and states his leg will occasionally give out ever since this surgery. Pt states he took 2 Ibuprofen last night and 2 this morning but denies any relief. Admits to swelling last night which has since resolved.    Past Medical History  Diagnosis Date  . Arthritis   . ADHD (attention deficit hyperactivity disorder)    Past Surgical History  Procedure Laterality Date  . Tibia fracture surgery     Family History  Problem Relation Age of Onset  . Hypertension Mother    Social History  Substance Use Topics  . Smoking status: Never Smoker   . Smokeless tobacco: Never Used  . Alcohol Use: Yes     Comment: occ    Review of Systems  Constitutional: Negative for fever and chills.  Musculoskeletal: Positive for myalgias and arthralgias.  Neurological: Negative for syncope and headaches.  All other systems reviewed and are negative.     Allergies  Penicillins and Aspirin  Home Medications   Prior to Admission medications    Medication Sig Start Date End Date Taking? Authorizing Provider  HYDROcodone-acetaminophen (NORCO/VICODIN) 5-325 MG tablet Take 2 tablets by mouth every 4 (four) hours as needed. 08/18/15   Ozella Almond Anaysha Andre, PA-C  ibuprofen (ADVIL,MOTRIN) 800 MG tablet Take 1 tablet (800 mg total) by mouth 3 (three) times daily. 08/18/15   Chalonda Schlatter Pilcher [name], PA-C   BP 119/70 mmHg  Pulse 78  Temp(Src) 98.6 F (37 C) (Oral)  Resp 18  SpO2 100% Physical Exam  Constitutional: He is oriented to person, place, and time. He appears well-developed and well-nourished.  HENT:  Head: Normocephalic and atraumatic.  Cardiovascular: Normal rate, regular rhythm and normal heart sounds.   Pulmonary/Chest: Effort normal and breath sounds normal. No respiratory distress. He has no wheezes. He has no rales.  Abdominal: Soft. He exhibits no distension. There is no tenderness.  Musculoskeletal:  Left knee: No gross deformity noted. Knee with full ROM. TTP over patella and patellar tendon. No joint line tenderness.There is no joint effusion or swelling appreciated. No abnormal alignment or patellar mobility. No bruising, erythema, or warmth overlaying the joint. No varus/valgus laxity. Negative drawer's, negative Lachman's, negative McMurray's, no crepitus.  2+ DP pulse's bilaterally. All compartments are soft. Sensation intact distal to injury.  Neurological: He is alert and oriented to person, place, and time.  Skin: Skin is warm and dry.  Psychiatric: He has a normal mood and affect.  Nursing  note and vitals reviewed.   ED Course  Procedures (including critical care time)  DIAGNOSTIC STUDIES: Oxygen Saturation is 100% on room air, normal by my interpretation.    COORDINATION OF CARE: 2:27 PM Discussed treatment plan with pt at bedside and pt agreed to plan.   Labs Review Labs Reviewed - No data to display  Imaging Review Dg Knee Complete 4 Views Left  08/18/2015  CLINICAL DATA:  Fall on treadmill yesterday  with persistent pain, initial encounter EXAM: LEFT KNEE - COMPLETE 4+ VIEW COMPARISON:  06/16/2014 FINDINGS: No acute fracture or dislocation is noted. Mild degenerative changes are noted laterally with joint space narrowing. Mild medial osteophytes are noted as well. IMPRESSION: No acute abnormality noted. Electronically Signed   By: Inez Catalina M.D.   On: 08/18/2015 14:56   I have personally reviewed and evaluated these images and lab results as part of my medical decision-making.   EKG Interpretation None      MDM   Final diagnoses:  Knee strain, left, initial encounter   Anthony Michael presents with left knee pain after falling on the treadmill last night. Knee exam with tenderness over patella so will x-ray. Patellar tendon also TTP.   X-rays show no acute abnormalities. Will treat symptomatically and encourage ice/ibuprofen use. Ortho follow up if no improvement.   I personally performed the services described in this documentation, which was scribed in my presence. The recorded information has been reviewed and is accurate.  [facility] Judi Jaffe, PA-C 08/18/15 [place], MD 08/19/15 [phone]

## 2015-09-02 ENCOUNTER — Emergency Department (HOSPITAL_COMMUNITY)
Admission: EM | Admit: 2015-09-02 | Discharge: 2015-09-02 | Disposition: A | Discharge status: Home / Self Care | Payer: Self-pay | Attending: Emergency Medicine | Admitting: Emergency Medicine

## 2015-09-02 DIAGNOSIS — S6991XA Unspecified injury of right wrist, hand and finger(s), initial encounter: Secondary | ICD-10-CM | POA: Insufficient documentation

## 2015-09-02 DIAGNOSIS — X58XXXA Exposure to other specified factors, initial encounter: Secondary | ICD-10-CM | POA: Insufficient documentation

## 2015-09-02 DIAGNOSIS — S4991XA Unspecified injury of right shoulder and upper arm, initial encounter: Secondary | ICD-10-CM | POA: Insufficient documentation

## 2015-09-02 DIAGNOSIS — Y998 Other external cause status: Secondary | ICD-10-CM | POA: Insufficient documentation

## 2015-09-02 DIAGNOSIS — Y9289 Other specified places as the place of occurrence of the external cause: Secondary | ICD-10-CM | POA: Insufficient documentation

## 2015-09-02 DIAGNOSIS — Y9389 Activity, other specified: Secondary | ICD-10-CM | POA: Insufficient documentation

## 2015-09-02 NOTE — ED Notes (Signed)
Pt is leaving states he has top pick up and friend and can not wait for a room

## 2015-09-02 NOTE — ED Notes (Signed)
Pt called for triage no answer  °

## 2015-11-17 ENCOUNTER — Emergency Department (HOSPITAL_COMMUNITY)
Admission: EM | Admit: 2015-11-17 | Discharge: 2015-11-18 | Disposition: A | Discharge status: Home / Self Care | Payer: Self-pay | Attending: Emergency Medicine | Admitting: Emergency Medicine

## 2015-11-17 ENCOUNTER — Emergency Department (HOSPITAL_COMMUNITY): Payer: Self-pay

## 2015-11-17 ENCOUNTER — Encounter (HOSPITAL_COMMUNITY): Payer: Self-pay | Admitting: Emergency Medicine

## 2015-11-17 DIAGNOSIS — Z791 Long term (current) use of non-steroidal anti-inflammatories (NSAID): Secondary | ICD-10-CM | POA: Insufficient documentation

## 2015-11-17 DIAGNOSIS — Z880 Allergy status to penicillin: Secondary | ICD-10-CM | POA: Insufficient documentation

## 2015-11-17 DIAGNOSIS — Y93A1 Activity, exercise machines primarily for cardiorespiratory conditioning: Secondary | ICD-10-CM | POA: Insufficient documentation

## 2015-11-17 DIAGNOSIS — M25562 Pain in left knee: Secondary | ICD-10-CM

## 2015-11-17 DIAGNOSIS — Z8659 Personal history of other mental and behavioral disorders: Secondary | ICD-10-CM | POA: Insufficient documentation

## 2015-11-17 DIAGNOSIS — M1712 Unilateral primary osteoarthritis, left knee: Secondary | ICD-10-CM | POA: Insufficient documentation

## 2015-11-17 DIAGNOSIS — X500XXA Overexertion from strenuous movement or load, initial encounter: Secondary | ICD-10-CM | POA: Insufficient documentation

## 2015-11-17 DIAGNOSIS — Y9289 Other specified places as the place of occurrence of the external cause: Secondary | ICD-10-CM | POA: Insufficient documentation

## 2015-11-17 DIAGNOSIS — Y998 Other external cause status: Secondary | ICD-10-CM | POA: Insufficient documentation

## 2015-11-17 MED ORDER — OXYCODONE-ACETAMINOPHEN 5-325 MG PO TABS
1.0000 | ORAL_TABLET | Freq: Once | ORAL | Status: AC
  Administered 2015-11-18: 1 via ORAL
  Filled 2015-11-17: qty 1

## 2015-11-17 NOTE — ED Provider Notes (Signed)
CSN: [id number]     Arrival date & time 11/17/15  2259 History  By signing my name below, I, Irene Pap, attest that this documentation has been prepared under the direction and in the presence of [employer], PA-C. Electronically Signed: Irene Pap, ED Scribe. 11/17/2015. 11:40 PM.   Chief Complaint  Patient presents with  . Knee Pain    left   The history is provided by the patient. No language interpreter was used.   HPI Comments: Anthony Michael is a 25 y.o. Male with a hx of arthritis who presents to the Emergency Department complaining of left knee pain onset 2 hours ago. Pt reports that he was running on the treadmill when his knee "gave out." He reports worsening pain with ambulation and states that when he walks upstairs, he hears a "popping" sound coming from the knee. He reports a hx of similar symptoms last year, but did not follow up with orthopedics due to lack of insurance. He has taken ibuprofen for pain to no relief. He denies numbness or weakness.  Past Medical History  Diagnosis Date  . Arthritis   . ADHD (attention deficit hyperactivity disorder)    Past Surgical History  Procedure Laterality Date  . Tibia fracture surgery     Family History  Problem Relation Age of Onset  . Hypertension Mother    Social History  Substance Use Topics  . Smoking status: Never Smoker   . Smokeless tobacco: Never Used  . Alcohol Use: Yes     Comment: occ    Review of Systems  Musculoskeletal: Positive for arthralgias.  Neurological: Negative for weakness and numbness.  All other systems reviewed and are negative.   Allergies  Penicillins and Aspirin  Home Medications   Prior to Admission medications   Medication Sig Start Date End Date Taking? Authorizing Provider  diclofenac sodium (VOLTAREN) 1 % GEL Apply 4 g topically 4 (four) times daily. Apply to your knee as needed for pain and swelling 11/18/15   Antonietta Breach, PA-C  HYDROcodone-acetaminophen (NORCO/VICODIN)  5-325 MG tablet Take 1 tablet by mouth every 6 (six) hours as needed for severe pain. 11/18/15   Antonietta Breach, PA-C  ibuprofen (ADVIL,MOTRIN) 800 MG tablet Take 1 tablet (800 mg total) by mouth 3 (three) times daily. 08/18/15   Jaime Pilcher Ward, PA-C   BP 120/80 mmHg  Pulse 73  Temp(Src) 98.2 F (36.8 C) (Oral)  Resp 20  Ht 5\' 8"  (1.727 m)  Wt 74.844 kg  BMI 25.09 kg/m2  SpO2 99%   Physical Exam  Constitutional: He is oriented to person, place, and time. He appears well-developed and well-nourished. No distress.  HENT:  Head: Normocephalic and atraumatic.  Eyes: Conjunctivae and EOM are normal. No scleral icterus.  Neck: Normal range of motion.  Cardiovascular: Normal rate, regular rhythm and intact distal pulses.   DP and PT pulses 2+ in the LLE  Pulmonary/Chest: Effort normal. No respiratory distress.  Respirations even and unlabored  Musculoskeletal: Normal range of motion.       Left knee: He exhibits normal range of motion (full PROM; AROM decreased secondary to pain), no effusion, no deformity, no erythema, no LCL laxity and no MCL laxity. Tenderness found. Medial joint line tenderness noted.       Left upper leg: Normal.       Left lower leg: Normal.  Neurological: He is alert and oriented to person, place, and time. He exhibits normal muscle tone. Coordination normal.  Patellar and  achilles reflexes intact.  Skin: Skin is warm and dry. No rash noted. He is not diaphoretic. No erythema. No pallor.  Psychiatric: He has a normal mood and affect. His behavior is normal.  Nursing note and vitals reviewed.   ED Course  Procedures (including critical care time) DIAGNOSTIC STUDIES: Oxygen Saturation is 99% on RA, normal by my interpretation.    COORDINATION OF CARE: 11:41 PM-Discussed treatment plan which includes x-ray and knee brace with pt at bedside and pt agreed to plan.    Labs Review Labs Reviewed - No data to display  Imaging Review Dg Knee Complete 4 Views  Left  11/18/2015  CLINICAL DATA:  Knee pain after working out at gym today. Knee gave out. EXAM: LEFT KNEE - COMPLETE 4+ VIEW COMPARISON:  LEFT knee pain August 18, 2015 and priors. FINDINGS: There is no evidence of fracture, dislocation, or joint effusion. Stable mild tibial spine peaking and mild medial and lateral compartment marginal spurring compatible with early osteoarthrosis. There is no evidence of arthropathy or other focal bone abnormality. Soft tissues are unremarkable. IMPRESSION: Early osteoarthrosis without acute fracture deformity or dislocation. Electronically Signed   By: Elon Alas M.D.   On: 11/18/2015 00:10     I have personally reviewed and evaluated these images and lab results as part of my medical decision-making.   EKG Interpretation None      MDM   Final diagnoses:  Knee pain, left  Osteoarthritis of left knee, unspecified osteoarthritis type    25 year old male just the ED for evaluation of left knee pain after his leg "went out" while working out. He reports a history of similar symptoms over the past year. Patient neurovascularly intact. No concern for septic joint. X-ray shows evidence of early osteoarthritis, though I do not suspect that this is the cause of patient's pain. I have a higher suspicion for ligamentous injury or strain, especially in light of patient's reports of episodic instability. Have recommended consistent use of bracing. Crutches given for weightbearing as tolerated. Will refer to orthopedics for follow-up. Patient given prescription for Voltaren gel and short course of Vicodin. Return precautions discussed and provided. Patient discharged in satisfactory condition with no unaddressed concerns.  I personally performed the services described in this documentation, which was scribed in my presence. The recorded information has been reviewed and is accurate.    Filed Vitals:   11/17/15 2314  BP: 120/80  Pulse: 73  Temp: 98.2 F (36.8  C)  TempSrc: Oral  Resp: 20  Height: 5\' 8"  (1.727 m)  Weight: 74.844 kg  SpO2: 99%      Antonietta Breach, PA-C 11/18/15 0059  Orpah Greek, MD 11/18/15 0225

## 2015-11-17 NOTE — ED Notes (Signed)
Patient states he was using the treadmill and his leg "went out". Patient states his knee is hurting, states it pops when he walks.

## 2015-11-18 MED ORDER — DICLOFENAC SODIUM 1 % TD GEL: 4.0000 g | Freq: Four times a day (QID) | TRANSDERMAL | Status: AC

## 2015-11-18 MED ORDER — HYDROCODONE-ACETAMINOPHEN 5-325 MG PO TABS: 1.0000 | ORAL_TABLET | Freq: Four times a day (QID) | ORAL | Status: DC | PRN

## 2015-11-18 NOTE — Discharge Instructions (Signed)
Knee Pain Knee pain is a very common symptom and can have many causes. Knee pain often goes away when you follow your health care provider's instructions for relieving pain and discomfort at home. However, knee pain can develop into a condition that needs treatment. Some conditions may include:  Arthritis caused by wear and tear (osteoarthritis).  Arthritis caused by swelling and irritation (rheumatoid arthritis or gout).  A cyst or growth in your knee.  An infection in your knee joint.  An injury that will not heal.  Damage, swelling, or irritation of the tissues that support your knee (torn ligaments or tendinitis). If your knee pain continues, additional tests may be ordered to diagnose your condition. Tests may include X-rays or other imaging studies of your knee. You may also need to have fluid removed from your knee. Treatment for ongoing knee pain depends on the cause, but treatment may include:  Medicines to relieve pain or swelling.  Steroid injections in your knee.  Physical therapy.  Surgery. HOME CARE INSTRUCTIONS  Take medicines only as directed by your health care provider.  Rest your knee and keep it raised (elevated) while you are resting.  Do not do things that cause or worsen pain.  Avoid high-impact activities or exercises, such as running, jumping rope, or doing jumping jacks.  Apply ice to the knee area:  Put ice in a plastic bag.  Place a towel between your skin and the bag.  Leave the ice on for 20 minutes, 2-3 times a day.  Ask your health care provider if you should wear an elastic knee support.  Keep a pillow under your knee when you sleep.  Lose weight if you are overweight. Extra weight can put pressure on your knee.  Do not use any tobacco products, including cigarettes, chewing tobacco, or electronic cigarettes. If you need help quitting, ask your health care provider. Smoking may slow the healing of any bone and joint problems that you may  have. SEEK MEDICAL CARE IF:  Your knee pain continues, changes, or gets worse.  You have a fever along with knee pain.  Your knee buckles or locks up.  Your knee becomes more swollen. SEEK IMMEDIATE MEDICAL CARE IF:   Your knee joint feels hot to the touch.  You have chest pain or trouble breathing.   This information is not intended to replace advice given to you by your health care provider. Make sure you discuss any questions you have with your health care provider.   Document Released: 05/04/2007 Document Revised: 07/28/2014 Document Reviewed: 02/20/2014 Elsevier Interactive Patient Education 2016 Elsevier Inc.  Cryotherapy Cryotherapy is when you put ice on your injury. Ice helps lessen pain and puffiness (swelling) after an injury. Ice works the best when you start using it in the first 24 to 48 hours after an injury. HOME CARE  Put a dry or damp towel between the ice pack and your skin.  You may press gently on the ice pack.  Leave the ice on for no more than 10 to 20 minutes at a time.  Check your skin after 5 minutes to make sure your skin is okay.  Rest at least 20 minutes between ice pack uses.  Stop using ice when your skin loses feeling (numbness).  Do not use ice on someone who cannot tell you when it hurts. This includes small children and people with memory problems (dementia). GET HELP RIGHT AWAY IF:  You have white spots on your skin.  Your skin turns blue or pale.  Your skin feels waxy or hard.  Your puffiness gets worse. MAKE SURE YOU:   Understand these instructions.  Will watch your condition.  Will get help right away if you are not doing well or get worse.   This information is not intended to replace advice given to you by your health care provider. Make sure you discuss any questions you have with your health care provider.   Document Released: 12/24/2007 Document Revised: 09/29/2011 Document Reviewed: 02/27/2011 Elsevier  Interactive Patient Education [employer].

## 2016-01-02 ENCOUNTER — Emergency Department (HOSPITAL_COMMUNITY)
Admission: EM | Admit: 2016-01-02 | Discharge: 2016-01-03 | Disposition: A | Discharge status: Home / Self Care | Payer: Self-pay | Attending: Emergency Medicine | Admitting: Emergency Medicine

## 2016-01-02 ENCOUNTER — Encounter (HOSPITAL_COMMUNITY): Payer: Self-pay | Admitting: *Deleted

## 2016-01-02 ENCOUNTER — Emergency Department (HOSPITAL_COMMUNITY): Payer: Self-pay

## 2016-01-02 DIAGNOSIS — T1490XA Injury, unspecified, initial encounter: Secondary | ICD-10-CM

## 2016-01-02 DIAGNOSIS — X501XXA Overexertion from prolonged static or awkward postures, initial encounter: Secondary | ICD-10-CM | POA: Insufficient documentation

## 2016-01-02 DIAGNOSIS — S93402A Sprain of unspecified ligament of left ankle, initial encounter: Secondary | ICD-10-CM | POA: Insufficient documentation

## 2016-01-02 DIAGNOSIS — M1990 Unspecified osteoarthritis, unspecified site: Secondary | ICD-10-CM | POA: Insufficient documentation

## 2016-01-02 DIAGNOSIS — Y998 Other external cause status: Secondary | ICD-10-CM | POA: Insufficient documentation

## 2016-01-02 DIAGNOSIS — Y9344 Activity, trampolining: Secondary | ICD-10-CM | POA: Insufficient documentation

## 2016-01-02 DIAGNOSIS — Y92830 Public park as the place of occurrence of the external cause: Secondary | ICD-10-CM | POA: Insufficient documentation

## 2016-01-02 MED ORDER — OXYCODONE-ACETAMINOPHEN 5-325 MG PO TABS: 1.0000 | ORAL_TABLET | ORAL | Status: AC | PRN

## 2016-01-02 MED ORDER — OXYCODONE-ACETAMINOPHEN 5-325 MG PO TABS
1.0000 | ORAL_TABLET | ORAL | Status: DC | PRN
  Administered 2016-01-02: 1 via ORAL
  Filled 2016-01-02: qty 1

## 2016-01-02 MED ORDER — IBUPROFEN 200 MG PO TABS: 600.0000 mg | ORAL_TABLET | Freq: Once | ORAL | Status: DC

## 2016-01-02 NOTE — ED Notes (Signed)
Pt states he rolled his left ankle today at a trampoline park today at 430PM. Pt has limited ROM in ankle, is able to dorsal and plantar flex foot. No swelling noted in triage.

## 2016-01-02 NOTE — Discharge Instructions (Signed)
Ankle Sprain °An ankle sprain is an injury to the strong, fibrous tissues (ligaments) that hold your ankle bones together.  °HOME CARE  °· Put ice on your ankle for 1-2 days or as told by your doctor. °¨ Put ice in a plastic bag. °¨ Place a towel between your skin and the bag. °¨ Leave the ice on for 15-20 minutes at a time, every 2 hours while you are awake. °· Only take medicine as told by your doctor. °· Raise (elevate) your injured ankle above the level of your heart as much as possible for 2-3 days. °· Use crutches if your doctor tells you to. Slowly put your own weight on the affected ankle. Use the crutches until you can walk without pain. °· If you have a plaster splint: °¨ Do not rest it on anything harder than a pillow for 24 hours. °¨ Do not put weight on it. °¨ Do not get it wet. °¨ Take it off to shower or bathe. °· If given, use an elastic wrap or support stocking for support. Take the wrap off if your toes lose feeling (numb), tingle, or turn cold or blue. °· If you have an air splint: °¨ Add or let out air to make it comfortable. °¨ Take it off at night and to shower and bathe. °¨ Wiggle your toes and move your ankle up and down often while you are wearing it. °GET HELP IF: °· You have rapidly increasing bruising or puffiness (swelling). °· Your toes feel very cold. °· You lose feeling in your foot. °· Your medicine does not help your pain. °GET HELP RIGHT AWAY IF:  °· Your toes lose feeling (numb) or turn blue. °· You have severe pain that is increasing. °MAKE SURE YOU:  °· Understand these instructions. °· Will watch your condition. °· Will get help right away if you are not doing well or get worse. °  °This information is not intended to replace advice given to you by your health care provider. Make sure you discuss any questions you have with your health care provider. °  °Document Released: 12/24/2007 Document Revised: 07/28/2014 Document Reviewed: 01/19/2012 °Elsevier Interactive Patient  Education ©2016 Elsevier Inc. ° °

## 2016-01-02 NOTE — ED Provider Notes (Signed)
CSN: [id number]     Arrival date & time 01/02/16  2032 History   First MD Initiated Contact with Patient 01/02/16 2343     Chief Complaint  Patient presents with  . Ankle Injury   PT SAID THAT HE WAS JUMPING ON A TRAMPOLINE TODAY WHEN HE ROLLED HIS ANKLE.  PT SAID THAT IT HURTS TO WALK ON IT AND TO MOVE IT.  (Consider location/radiation/quality/duration/timing/severity/associated sxs/prior Treatment) Patient is a 25 y.o. male presenting with lower extremity injury. The history is provided by the patient.  Ankle Injury This is a new problem. The current episode started 3 to 5 hours ago. The problem occurs constantly. Nothing aggravates the symptoms. Nothing relieves the symptoms.    Past Medical History  Diagnosis Date  . Arthritis   . ADHD (attention deficit hyperactivity disorder)    Past Surgical History  Procedure Laterality Date  . Tibia fracture surgery     Family History  Problem Relation Age of Onset  . Hypertension Mother    Social History  Substance Use Topics  . Smoking status: Never Smoker   . Smokeless tobacco: Never Used  . Alcohol Use: Yes     Comment: occ    Review of Systems  Musculoskeletal:       LEFT ANKLE PAIN  All other systems reviewed and are negative.     Allergies  Penicillins and Aspirin  Home Medications   Prior to Admission medications   Medication Sig Start Date End Date Taking? Authorizing Provider  diclofenac sodium (VOLTAREN) 1 % GEL Apply 4 g topically 4 (four) times daily. Apply to your knee as needed for pain and swelling 11/18/15   Antonietta Breach, PA-C  HYDROcodone-acetaminophen (NORCO/VICODIN) 5-325 MG tablet Take 1 tablet by mouth every 6 (six) hours as needed for severe pain. 11/18/15   Antonietta Breach, PA-C  ibuprofen (ADVIL,MOTRIN) 800 MG tablet Take 1 tablet (800 mg total) by mouth 3 (three) times daily. 08/18/15   Ozella Almond Ward, PA-C  oxyCODONE-acetaminophen (PERCOCET/ROXICET) 5-325 MG tablet Take 1 tablet by mouth every 4  (four) hours as needed for severe pain. 01/02/16   Isla Pence, MD   BP 114/74 mmHg  Pulse 65  Temp(Src) 97.9 F (36.6 C) (Oral)  Resp 18  SpO2 100% Physical Exam  Constitutional: He is oriented to person, place, and time. He appears well-developed and well-nourished.  HENT:  Head: Normocephalic and atraumatic.  Right Ear: External ear normal.  Left Ear: External ear normal.  Nose: Nose normal.  Mouth/Throat: Oropharynx is clear and moist.  Eyes: Conjunctivae and EOM are normal. Pupils are equal, round, and reactive to light.  Neck: Normal range of motion. Neck supple.  Cardiovascular: Normal rate, regular rhythm, normal heart sounds and intact distal pulses.   Pulmonary/Chest: Effort normal and breath sounds normal.  Abdominal: Soft. Bowel sounds are normal.  Musculoskeletal:       Left ankle: He exhibits swelling. Tenderness. Lateral malleolus tenderness found.  Neurological: He is alert and oriented to person, place, and time.  Skin: Skin is warm and dry.  Psychiatric: He has a normal mood and affect. His behavior is normal. Thought content normal.  Nursing note and vitals reviewed.   ED Course  Procedures (including critical care time) Labs Review Labs Reviewed - No data to display  Imaging Review Dg Ankle Complete Left  01/02/2016  CLINICAL DATA:  Left ankle injury. Pain in left ankle. Injury while jumping on trampoline. Personal history of left tibia and fibular fracture. EXAM:  LEFT ANKLE COMPLETE - 3+ VIEW COMPARISON:  None. FINDINGS: The ankle joint is located. A small effusion is present. No acute fracture is evident. The hindfoot is within normal limits. Remote midshaft tibia fracture is noted. IMPRESSION: 1. Small joint effusion without acute osseous abnormality. 2. Healed remote tibial fracture. Electronically Signed   By: San Morelle M.D.   On: 01/02/2016 21:35   I have personally reviewed and evaluated these images and lab results as part of my medical  decision-making.   EKG Interpretation None      MDM  PT GIVEN AN AIR CAST AND A RX FOR PERCOCET.  PT IS ALSO GIVEN THE NUMBER TO ORTHO.  PT KNOWS TO RETURN IF WORSE. Final diagnoses:  Left ankle sprain, initial encounter       Isla Pence, MD 01/02/16 2352

## 2016-03-01 ENCOUNTER — Emergency Department (HOSPITAL_COMMUNITY)
Admission: EM | Admit: 2016-03-01 | Discharge: 2016-03-01 | Disposition: A | Discharge status: Home / Self Care | Payer: Self-pay | Attending: Emergency Medicine | Admitting: Emergency Medicine

## 2016-03-01 ENCOUNTER — Encounter (HOSPITAL_COMMUNITY): Payer: Self-pay | Admitting: Emergency Medicine

## 2016-03-01 DIAGNOSIS — F909 Attention-deficit hyperactivity disorder, unspecified type: Secondary | ICD-10-CM | POA: Insufficient documentation

## 2016-03-01 DIAGNOSIS — M25572 Pain in left ankle and joints of left foot: Secondary | ICD-10-CM | POA: Insufficient documentation

## 2016-03-01 MED ORDER — NAPROXEN 500 MG PO TABS
500.0000 mg | ORAL_TABLET | Freq: Once | ORAL | Status: DC
  Filled 2016-03-01: qty 1

## 2016-03-01 MED ORDER — NAPROXEN 500 MG PO TABS: 500.0000 mg | ORAL_TABLET | Freq: Two times a day (BID) | ORAL | 0 refills | Status: DC

## 2016-03-01 MED ORDER — OXYCODONE-ACETAMINOPHEN 5-325 MG PO TABS
1.0000 | ORAL_TABLET | Freq: Once | ORAL | Status: AC
  Administered 2016-03-01: 1 via ORAL
  Filled 2016-03-01: qty 1

## 2016-03-01 NOTE — ED Triage Notes (Signed)
Pt c/o L ankle pain, reoccuring since injuring it in June. Pt states he works long hours on his feet and is concerned he may have re injured the ankle.

## 2016-03-01 NOTE — ED Provider Notes (Signed)
[facility] DEPT Provider Note   CSN: [id number] Arrival date & time: 03/01/16  [id number]  First Provider Contact:  None       History   Chief Complaint Chief Complaint  Patient presents with  . Ankle Pain    HPI Anthony Michael is a 25 y.o. male.  Patient presents to the emergency room with left ankle pain. He reports he sprained his ankle in June and has had persistent pain that is now worse. He was seen at the time of the original injury with negative x-ray for fracture. He was given a velcro brace and crutches but has not been using them. 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 He presents tonight requesting something strong for pain.   The history is provided by the patient. No language interpreter was used.    Past Medical History:  Diagnosis Date  . ADHD (attention deficit hyperactivity disorder)   . Arthritis     There are no active problems to display for this patient.   Past Surgical History:  Procedure Laterality Date  . TIBIA FRACTURE SURGERY         Home Medications    Prior to Admission medications   Medication Sig Start Date End Date Taking? Authorizing Provider  diclofenac sodium (VOLTAREN) 1 % GEL Apply 4 g topically 4 (four) times daily. Apply to your knee as needed for pain and swelling 11/18/15   Antonietta Breach, PA-C  HYDROcodone-acetaminophen (NORCO/VICODIN) 5-325 MG tablet Take 1 tablet by mouth every 6 (six) hours as needed for severe pain. 11/18/15   Antonietta Breach, PA-C  ibuprofen (ADVIL,MOTRIN) 800 MG tablet Take 1 tablet (800 mg total) by mouth 3 (three) times daily. 08/18/15   Ozella Almond Ward, PA-C  oxyCODONE-acetaminophen (PERCOCET/ROXICET) 5-325 MG tablet Take 1 tablet by mouth every 4 (four) hours as needed for severe pain. 01/02/16   Isla Pence, MD    Family History Family History  Problem Relation Age of Onset  . Hypertension Mother     Social  History Social History  Substance Use Topics  . Smoking status: Never Smoker  . Smokeless tobacco: Never Used  . Alcohol use Yes     Comment: occ     Allergies   Penicillins and Aspirin   Review of Systems Review of Systems  Gastrointestinal: Negative.  Negative for nausea.  Musculoskeletal:       See HPI  Skin: Negative.  Negative for color change and wound.  Neurological: Negative.  Negative for numbness.     Physical Exam Updated Vital Signs BP 125/69 (BP Location: Left Arm)   Pulse 68   Temp 98.5 F (36.9 C) (Oral)   Resp 20   SpO2 100%   Physical Exam  Constitutional: He appears well-developed and well-nourished. No distress.  Musculoskeletal:  Left ankle not swollen, red or deformed. Tender to light touch. Distal pulses intact.   Psychiatric: His affect is angry. He is agitated.     ED Treatments / Results  Labs (all labs ordered are listed, but only abnormal results are displayed) Labs Reviewed - No data to display  EKG  EKG Interpretation None       Radiology No results found.  Procedures Procedures (including critical care time)  Medications Ordered in ED Medications - No data to display   Initial Impression / Assessment and Plan / ED Course  I have reviewed the  triage vital signs and the nursing notes.  Pertinent labs & imaging results that were available during my care of the patient were reviewed by me and considered in my medical decision making (see chart for details).  Clinical Course    Patient presents over one month after initial ankle injury complaining of persistent pain. He has not been able to follow care instructions of rest, elevation, weight bearing as tolerated with crutches or use of ASO splint. He has not followed up with orthopedics.   Discussed that we would treat his pain in the ED and recommended anti-inflammatory medication in the outpatient setting. He repeatedly asks for "something strong like what I was given  before". Discussed narcotic prescriptions were not indicated secondary to age of injury. He is not happy with his care and becomes more irate. He is discharged home with Rx Naproxen and again referred to orthopedics.   Final Clinical Impressions(s) / ED Diagnoses   Final diagnoses:  None  1. Left ankle pain  New Prescriptions New Prescriptions   No medications on file     Charlann Lange, Hershal Coria 03/01/16 0326    Daleen Bo, MD 03/01/16 [phone]

## 2016-07-01 ENCOUNTER — Emergency Department (HOSPITAL_COMMUNITY): Payer: Self-pay

## 2016-07-01 ENCOUNTER — Emergency Department (HOSPITAL_COMMUNITY)
Admission: EM | Admit: 2016-07-01 | Discharge: 2016-07-01 | Disposition: A | Discharge status: Home / Self Care | Payer: Self-pay | Attending: Emergency Medicine | Admitting: Emergency Medicine

## 2016-07-01 ENCOUNTER — Encounter (HOSPITAL_COMMUNITY): Payer: Self-pay | Admitting: Family Medicine

## 2016-07-01 DIAGNOSIS — S93401A Sprain of unspecified ligament of right ankle, initial encounter: Secondary | ICD-10-CM | POA: Insufficient documentation

## 2016-07-01 DIAGNOSIS — Z79899 Other long term (current) drug therapy: Secondary | ICD-10-CM | POA: Insufficient documentation

## 2016-07-01 DIAGNOSIS — W1849XA Other slipping, tripping and stumbling without falling, initial encounter: Secondary | ICD-10-CM | POA: Insufficient documentation

## 2016-07-01 DIAGNOSIS — F909 Attention-deficit hyperactivity disorder, unspecified type: Secondary | ICD-10-CM | POA: Insufficient documentation

## 2016-07-01 DIAGNOSIS — Y929 Unspecified place or not applicable: Secondary | ICD-10-CM | POA: Insufficient documentation

## 2016-07-01 DIAGNOSIS — Y990 Civilian activity done for income or pay: Secondary | ICD-10-CM | POA: Insufficient documentation

## 2016-07-01 DIAGNOSIS — Y939 Activity, unspecified: Secondary | ICD-10-CM | POA: Insufficient documentation

## 2016-07-01 MED ORDER — HYDROCODONE-ACETAMINOPHEN 5-325 MG PO TABS: 1.0000 | ORAL_TABLET | Freq: Four times a day (QID) | ORAL | 0 refills | Status: DC | PRN

## 2016-07-01 MED ORDER — IBUPROFEN 800 MG PO TABS: 800.0000 mg | ORAL_TABLET | Freq: Three times a day (TID) | ORAL | 0 refills | Status: DC

## 2016-07-01 MED ORDER — NAPROXEN 500 MG PO TABS
500.0000 mg | ORAL_TABLET | Freq: Once | ORAL | Status: AC
  Administered 2016-07-01: 500 mg via ORAL
  Filled 2016-07-01: qty 1

## 2016-07-01 MED ORDER — OXYCODONE-ACETAMINOPHEN 5-325 MG PO TABS
1.0000 | ORAL_TABLET | Freq: Once | ORAL | Status: AC
  Administered 2016-07-01: 1 via ORAL
  Filled 2016-07-01: qty 1

## 2016-07-01 NOTE — ED Provider Notes (Signed)
[facility] DEPT Provider Note   CSN: [id number] Arrival date & time: 07/01/16  0103     History   Chief Complaint Chief Complaint  Patient presents with  . Foot Pain    HPI Anthony Michael is a 25 y.o. male.  25 year old male comes to the emergency department for evaluation of right ankle pain. Pain began after he jumped off of a stove and twisted his ankle after slipping on grease. Patient took some Tylenol prior to arrival without significant improvement. He states that pain is constant and worse with weightbearing. His pain is improved with plantar flexion of his foot. He denies any prior injury to his right foot or ankle. No numbness, tingling, or weakness.   The history is provided by the patient. No language interpreter was used.  Foot Pain     Past Medical History:  Diagnosis Date  . ADHD (attention deficit hyperactivity disorder)   . Arthritis     There are no active problems to display for this patient.   Past Surgical History:  Procedure Laterality Date  . TIBIA FRACTURE SURGERY         Home Medications    Prior to Admission medications   Medication Sig Start Date End Date Taking? Authorizing Provider  diclofenac sodium (VOLTAREN) 1 % GEL Apply 4 g topically 4 (four) times daily. Apply to your knee as needed for pain and swelling 11/18/15   Antonietta Breach, PA-C  HYDROcodone-acetaminophen (NORCO/VICODIN) 5-325 MG tablet Take 1 tablet by mouth every 6 (six) hours as needed for severe pain. 07/01/16   Antonietta Breach, PA-C  ibuprofen (ADVIL,MOTRIN) 800 MG tablet Take 1 tablet (800 mg total) by mouth 3 (three) times daily. 07/01/16   Antonietta Breach, PA-C  naproxen (NAPROSYN) 500 MG tablet Take 1 tablet (500 mg total) by mouth 2 (two) times daily. 03/01/16   Charlann Lange, PA-C  oxyCODONE-acetaminophen (PERCOCET/ROXICET) 5-325 MG tablet Take 1 tablet by mouth every 4 (four) hours as needed for severe pain. 01/02/16   Isla Pence, MD    Family History Family  History  Problem Relation Age of Onset  . Hypertension Mother     Social History Social History  Substance Use Topics  . Smoking status: Never Smoker  . Smokeless tobacco: Never Used  . Alcohol use Yes     Comment: Once a week.      Allergies   Penicillins and Aspirin   Review of Systems Review of Systems Ten systems reviewed and are negative for acute change, except as noted in the HPI.    Physical Exam Updated Vital Signs Ht 5\' 8"  (1.727 m)   Wt 72.6 kg   BMI 24.33 kg/m   Physical Exam  Constitutional: He is oriented to person, place, and time. He appears well-developed and well-nourished. No distress.  Nontoxic and in NAD  HENT:  Head: Normocephalic and atraumatic.  Eyes: Conjunctivae and EOM are normal. No scleral icterus.  Neck: Normal range of motion.  Cardiovascular: Normal rate, regular rhythm and intact distal pulses.   DP and PT pulses 2+ in the RLE  Pulmonary/Chest: Effort normal. No respiratory distress.  Musculoskeletal:       Right ankle: He exhibits decreased range of motion (secondary to pain). He exhibits no ecchymosis, no deformity, no laceration and normal pulse. Tenderness. Lateral malleolus tenderness found. Achilles tendon exhibits pain. Achilles tendon exhibits normal Thompson's test results.  Neurological: He is alert and oriented to person, place, and time. He exhibits normal muscle tone. Coordination normal.  Sensation to light touch intact in the RLE. Patient able to wiggle all toes.  Skin: Skin is warm and dry. No rash noted. He is not diaphoretic. No erythema. No pallor.  Psychiatric: He has a normal mood and affect. His behavior is normal.  Nursing note and vitals reviewed.    ED Treatments / Results  Labs (all labs ordered are listed, but only abnormal results are displayed) Labs Reviewed - No data to display  EKG  EKG Interpretation None       Radiology Dg Ankle Complete Right  Result Date: 07/01/2016 CLINICAL DATA:   Ankle pain after twisting injury. EXAM: RIGHT ANKLE - COMPLETE 3+ VIEW COMPARISON:  None. FINDINGS: Ankle mortise appears congruent. No joint effusion is seen. The subtalar joints are maintained. No acute osseous abnormality. Base of fifth metatarsal appears intact. Minimal soft tissue swelling about the ankle. IMPRESSION: Soft tissue swelling without acute osseous abnormality. Electronically Signed   By: Ashley Royalty M.D.   On: 07/01/2016 01:45    Procedures Procedures (including critical care time)  Medications Ordered in ED Medications  oxyCODONE-acetaminophen (PERCOCET/ROXICET) 5-325 MG per tablet 1 tablet (1 tablet Oral Given 07/01/16 0323)  naproxen (NAPROSYN) tablet 500 mg (500 mg Oral Given 07/01/16 0323)     Initial Impression / Assessment and Plan / ED Course  I have reviewed the triage vital signs and the nursing notes.  Pertinent labs & imaging results that were available during my care of the patient were reviewed by me and considered in my medical decision making (see chart for details).  Clinical Course     25 year old male presents to the emergency department for right ankle pain after twisting his ankle while at work. He is neurovascularly intact. Mild swelling noted. X-ray negative for fracture or other osseous abnormality. There is TTP to the Achilles tendon. No absence of tendon;Thompson's test negative. Unable to rule out partial rupture vs strain of Achilles tendon. Symptoms to be managed supportively with Cam Walker and crutches for WBAT. Ortho referral given for follow up. Return precautions discussed and provided. Patient discharged in stable condition with no unaddressed concerns.   Final Clinical Impressions(s) / ED Diagnoses   Final diagnoses:  Sprain of right ankle, unspecified ligament, initial encounter    New Prescriptions Current Discharge Medication List       Antonietta Breach, PA-C [id number]    Delora Fuel, MD [id number]

## 2016-07-01 NOTE — ED Triage Notes (Signed)
Patient reports he jumped down from a stove while at work and slipped on grease. He is experiencing right ankle pain.

## 2016-11-10 ENCOUNTER — Encounter (HOSPITAL_COMMUNITY): Payer: Self-pay | Admitting: Emergency Medicine

## 2016-11-10 ENCOUNTER — Emergency Department (HOSPITAL_COMMUNITY): Payer: Self-pay

## 2016-11-10 ENCOUNTER — Emergency Department (HOSPITAL_COMMUNITY)
Admission: EM | Admit: 2016-11-10 | Discharge: 2016-11-10 | Disposition: A | Discharge status: Home / Self Care | Payer: Self-pay | Attending: Emergency Medicine | Admitting: Emergency Medicine

## 2016-11-10 DIAGNOSIS — M25562 Pain in left knee: Secondary | ICD-10-CM | POA: Insufficient documentation

## 2016-11-10 DIAGNOSIS — M545 Low back pain, unspecified: Secondary | ICD-10-CM

## 2016-11-10 DIAGNOSIS — Y990 Civilian activity done for income or pay: Secondary | ICD-10-CM | POA: Insufficient documentation

## 2016-11-10 DIAGNOSIS — F909 Attention-deficit hyperactivity disorder, unspecified type: Secondary | ICD-10-CM | POA: Insufficient documentation

## 2016-11-10 DIAGNOSIS — X500XXA Overexertion from strenuous movement or load, initial encounter: Secondary | ICD-10-CM | POA: Insufficient documentation

## 2016-11-10 DIAGNOSIS — Y929 Unspecified place or not applicable: Secondary | ICD-10-CM | POA: Insufficient documentation

## 2016-11-10 DIAGNOSIS — Y939 Activity, unspecified: Secondary | ICD-10-CM | POA: Insufficient documentation

## 2016-11-10 DIAGNOSIS — Z79899 Other long term (current) drug therapy: Secondary | ICD-10-CM | POA: Insufficient documentation

## 2016-11-10 MED ORDER — CYCLOBENZAPRINE HCL 10 MG PO TABS: 10.0000 mg | ORAL_TABLET | Freq: Every evening | ORAL | 0 refills | Status: AC | PRN

## 2016-11-10 MED ORDER — OXYCODONE-ACETAMINOPHEN 5-325 MG PO TABS
1.0000 | ORAL_TABLET | Freq: Once | ORAL | Status: AC
  Administered 2016-11-10: 1 via ORAL
  Filled 2016-11-10: qty 1

## 2016-11-10 MED ORDER — HYDROCODONE-ACETAMINOPHEN 5-325 MG PO TABS: 1.0000 | ORAL_TABLET | Freq: Four times a day (QID) | ORAL | 0 refills | Status: DC | PRN

## 2016-11-10 NOTE — Discharge Instructions (Signed)
Please read instructions below. Talk with your PCP about any new medications. You can take norco for severe pain. You can take over the counter anti-inflammatories as well. Do not take tylenol with norco. Take flexeril at bedtime as needed for muscle spasm. Do not drive or drink alcohol with norco or flexeril. Apply ice to your back and knee for 20 minutes at a time. You can also apply heat. Do not do any heavy lifting for the next few days until your back feels better.   Return to ER if new numbness or tingling in your arms or legs, inability to urinate, inability to hold your bowels, or weakness in your extremities.

## 2016-11-10 NOTE — ED Triage Notes (Signed)
Patient reports he was picking up heavy boxes at work this morning at 4am. Patient dropped the boxes due to them being so heavy. Patient is complaining of lower back and left knee pain.
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
## 2017-04-30 IMAGING — CR DG ANKLE COMPLETE 3+V*R*
3 series · 3 of 3 positions shown · non-contrast
Comparison: None.

CLINICAL DATA: Ankle pain after twisting injury.

EXAM:
RIGHT ANKLE - COMPLETE 3+ VIEW

[x ankle ap right]
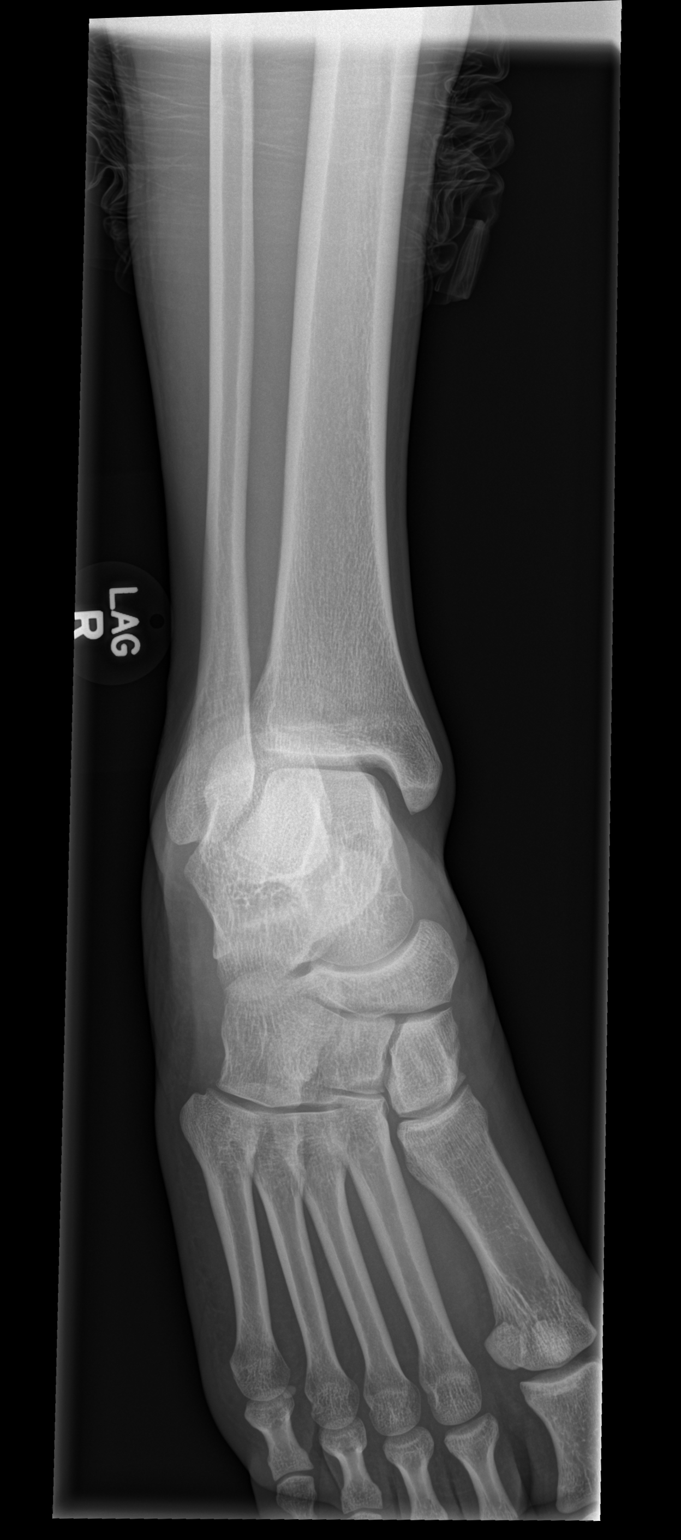

[x ankle obl right]
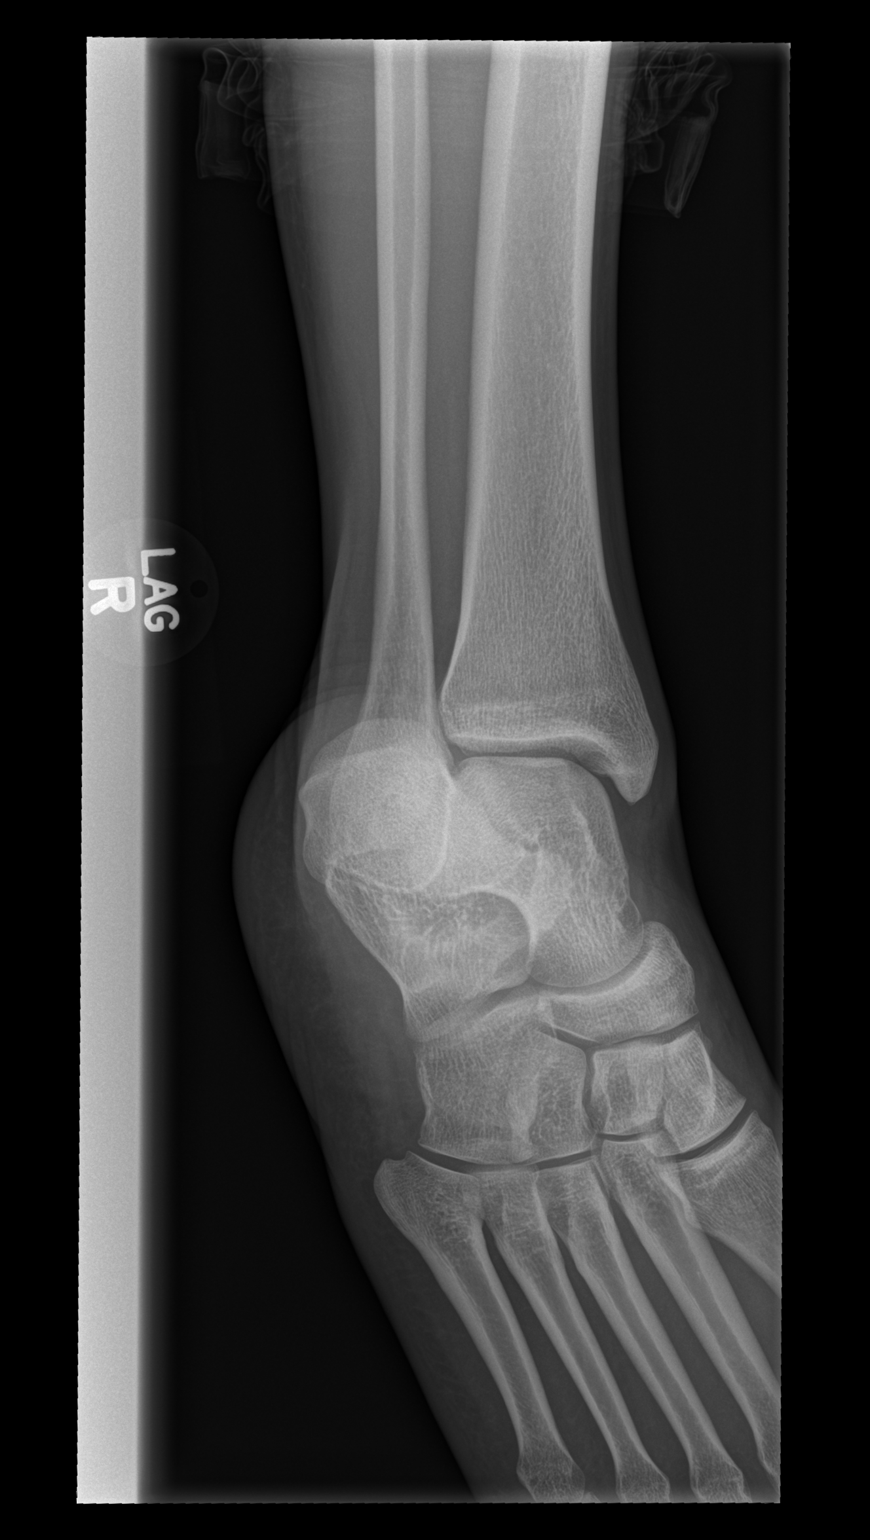

[x ankle lat right]
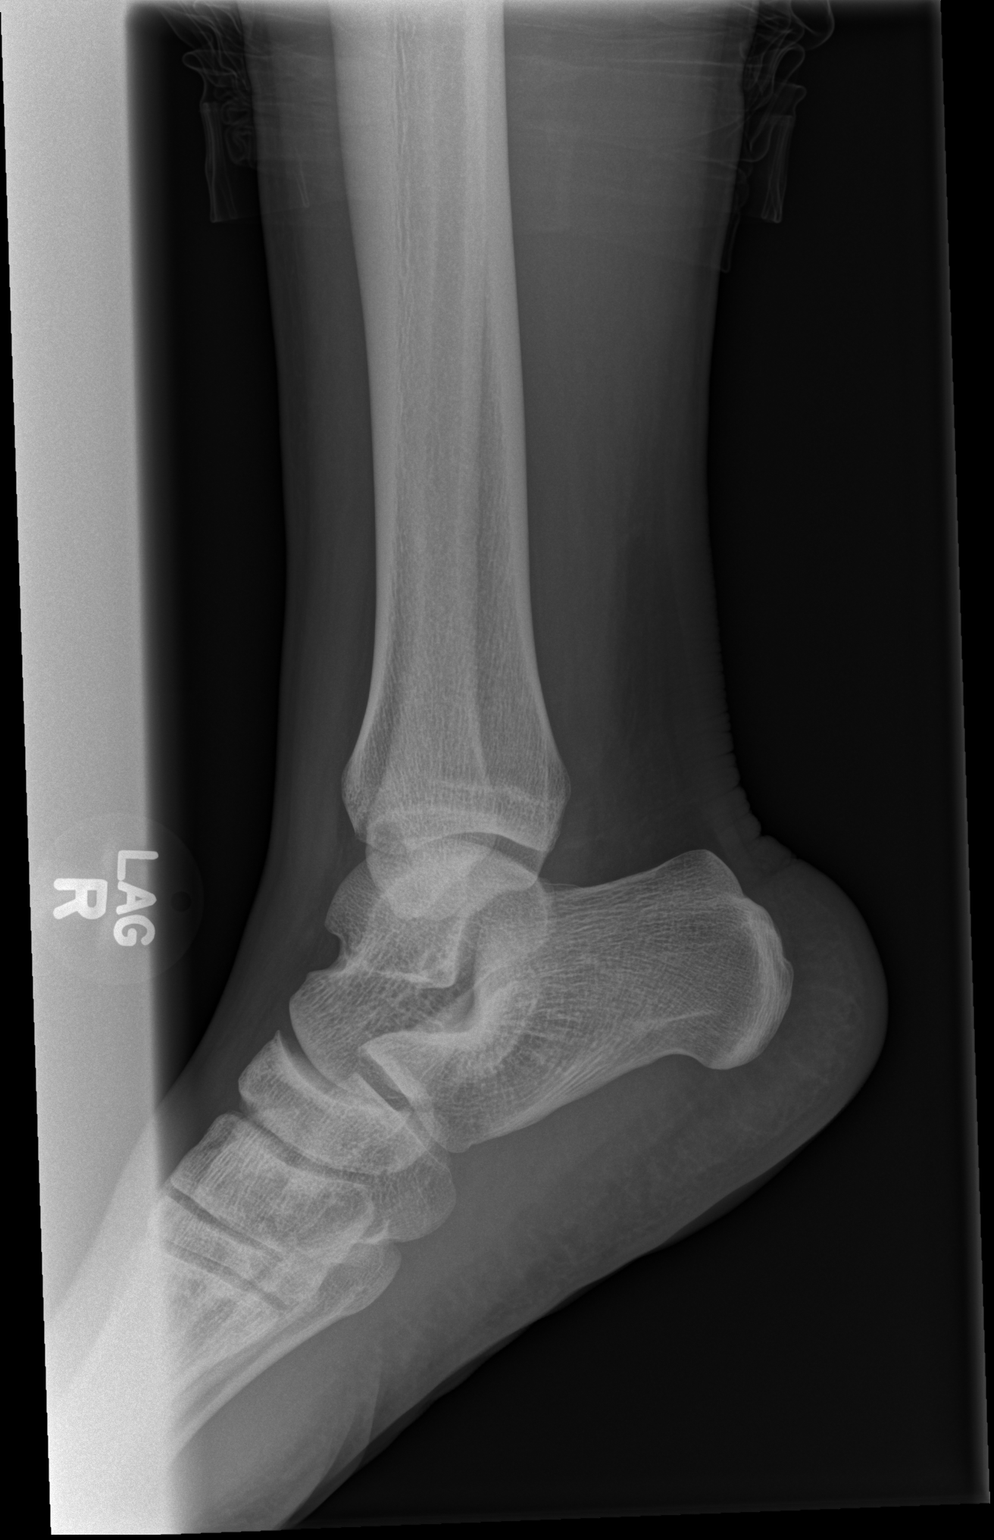

[3 of 3 positions shown; findings below may reference images not displayed]

FINDINGS: Ankle mortise appears congruent. No joint effusion is seen. The
subtalar joints are maintained. No acute osseous abnormality. Base
of fifth metatarsal appears intact. Minimal soft tissue swelling
about the ankle.
IMPRESSION: Soft tissue swelling without acute osseous abnormality.

## 2017-09-06 ENCOUNTER — Other Ambulatory Visit: Payer: Self-pay

## 2017-09-06 ENCOUNTER — Emergency Department (HOSPITAL_COMMUNITY): Payer: Self-pay

## 2017-09-06 ENCOUNTER — Encounter (HOSPITAL_COMMUNITY): Payer: Self-pay | Admitting: Emergency Medicine

## 2017-09-06 DIAGNOSIS — Z5321 Procedure and treatment not carried out due to patient leaving prior to being seen by health care provider: Secondary | ICD-10-CM | POA: Insufficient documentation

## 2017-09-06 MED ORDER — HYDROCODONE-ACETAMINOPHEN 5-325 MG PO TABS
1.0000 | ORAL_TABLET | Freq: Once | ORAL | Status: AC
  Administered 2017-09-06: 1 via ORAL
  Filled 2017-09-06: qty 1
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
## 2017-09-07 ENCOUNTER — Emergency Department (HOSPITAL_COMMUNITY)
Admission: EM | Admit: 2017-09-07 | Discharge: 2017-09-07 | Disposition: A | Discharge status: Home / Self Care | Payer: Self-pay | Attending: Emergency Medicine | Admitting: Emergency Medicine

## 2017-09-07 ENCOUNTER — Emergency Department (HOSPITAL_COMMUNITY)
Admission: EM | Admit: 2017-09-07 | Discharge: 2017-09-07 | Discharge status: Against Medical Advice | Payer: Self-pay | Attending: Emergency Medicine | Admitting: Emergency Medicine

## 2017-09-07 ENCOUNTER — Encounter (HOSPITAL_COMMUNITY): Payer: Self-pay | Admitting: Emergency Medicine

## 2017-09-07 DIAGNOSIS — Y939 Activity, unspecified: Secondary | ICD-10-CM | POA: Insufficient documentation

## 2017-09-07 DIAGNOSIS — Y999 Unspecified external cause status: Secondary | ICD-10-CM | POA: Insufficient documentation

## 2017-09-07 DIAGNOSIS — S61422A Laceration with foreign body of left hand, initial encounter: Secondary | ICD-10-CM | POA: Insufficient documentation

## 2017-09-07 DIAGNOSIS — W25XXXA Contact with sharp glass, initial encounter: Secondary | ICD-10-CM | POA: Insufficient documentation

## 2017-09-07 DIAGNOSIS — Z23 Encounter for immunization: Secondary | ICD-10-CM | POA: Insufficient documentation

## 2017-09-07 DIAGNOSIS — Z79899 Other long term (current) drug therapy: Secondary | ICD-10-CM | POA: Insufficient documentation

## 2017-09-07 DIAGNOSIS — Y929 Unspecified place or not applicable: Secondary | ICD-10-CM | POA: Insufficient documentation

## 2017-09-07 MED ORDER — LIDOCAINE-EPINEPHRINE (PF) 2 %-1:200000 IJ SOLN
5.0000 mL | Freq: Once | INTRAMUSCULAR | Status: AC
  Administered 2017-09-07: 5 mL
  Filled 2017-09-07: qty 20

## 2017-09-07 MED ORDER — TETANUS-DIPHTH-ACELL PERTUSSIS 5-2.5-18.5 LF-MCG/0.5 IM SUSP
0.5000 mL | Freq: Once | INTRAMUSCULAR | Status: AC
  Administered 2017-09-07: 0.5 mL via INTRAMUSCULAR
  Filled 2017-09-07: qty 0.5

## 2017-09-07 MED ORDER — DOXYCYCLINE HYCLATE 100 MG PO CAPS: 100.0000 mg | ORAL_CAPSULE | Freq: Two times a day (BID) | ORAL | 0 refills | Status: AC

## 2017-09-07 MED ORDER — HYDROCODONE-ACETAMINOPHEN 5-325 MG PO TABS
1.0000 | ORAL_TABLET | Freq: Once | ORAL | Status: AC
  Administered 2017-09-07: 1 via ORAL
  Filled 2017-09-07: qty 1

## 2017-09-07 NOTE — ED Notes (Signed)
Called pt to take to room  No answer from lobby

## 2017-09-07 NOTE — ED Notes (Signed)
Wound cleansed, applied bacitracin dry gauze, and wrapped with rolled gauze.
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
## 2017-09-07 NOTE — Discharge Instructions (Signed)
Please read instructions below.  Keep your wound clean and covered. Wash it daily with gentle soap and water.  Pat it dry and recover with a clean bandage.  You can apply a thin layer of antibiotic ointment, such as bacitracin or Neosporin. Begin taking the antibiotic as prescribed until it is gone. You can take advil/ibuprofen every 6 hours as needed for pain.  You can take Tylenol with this medication as needed. Apply ice to your hand for 20 minutes at a time. Return to the ER for fever, pus draining from wound, redness, or new or worsening symptoms.

## 2017-09-07 NOTE — ED Notes (Signed)
Pt called to take to room  No response from lobby
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
## 2017-09-08 ENCOUNTER — Emergency Department (HOSPITAL_COMMUNITY)
Admission: EM | Admit: 2017-09-08 | Discharge: 2017-09-08 | Disposition: A | Discharge status: Home / Self Care | Payer: Self-pay | Attending: Emergency Medicine | Admitting: Emergency Medicine

## 2017-09-08 ENCOUNTER — Other Ambulatory Visit: Payer: Self-pay

## 2017-09-08 ENCOUNTER — Encounter (HOSPITAL_COMMUNITY): Payer: Self-pay

## 2017-09-08 ENCOUNTER — Emergency Department (HOSPITAL_COMMUNITY): Payer: Self-pay

## 2017-09-08 DIAGNOSIS — F909 Attention-deficit hyperactivity disorder, unspecified type: Secondary | ICD-10-CM | POA: Insufficient documentation

## 2017-09-08 DIAGNOSIS — M25532 Pain in left wrist: Secondary | ICD-10-CM | POA: Insufficient documentation

## 2017-09-08 DIAGNOSIS — Z79899 Other long term (current) drug therapy: Secondary | ICD-10-CM | POA: Insufficient documentation

## 2017-09-08 MED ORDER — IBUPROFEN 600 MG PO TABS: 600.0000 mg | ORAL_TABLET | Freq: Four times a day (QID) | ORAL | 0 refills | Status: DC | PRN

## 2017-09-08 MED ORDER — HYDROCODONE-ACETAMINOPHEN 5-325 MG PO TABS
1.0000 | ORAL_TABLET | Freq: Once | ORAL | Status: AC
  Administered 2017-09-08: 1 via ORAL
  Filled 2017-09-08: qty 1

## 2017-09-08 MED ORDER — HYDROCODONE-ACETAMINOPHEN 5-325 MG PO TABS: 1.0000 | ORAL_TABLET | Freq: Four times a day (QID) | ORAL | 0 refills | Status: AC | PRN
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
## 2017-09-08 NOTE — Discharge Instructions (Signed)
You have been seen today for reevaluation of a wrist injury. There were no acute abnormalities on the x-rays, including no sign of fracture or dislocation, however, there could be injuries to the soft tissues, such as the ligaments or tendons that are not seen on xrays. There could also be what are called occult fractures that are small fractures not seen on xray. Pain: Take 600 mg of ibuprofen every 6 hours or 440 mg (over the counter dose) to 500 mg (prescription dose) of naproxen every 12 hours for the next 3 days. After this time, these medications may be used as needed for pain. Take these medications with food to avoid upset stomach. Choose only one of these medications, do not take them together.  Tylenol: Should you continue to have additional pain while taking the ibuprofen or naproxen, you may add in tylenol as needed. Your daily total maximum amount of tylenol from all sources should be limited to 4000mg /day for persons without liver problems, or 2000mg /day for those with liver problems. Vicodin: May take Vicodin as needed for severe pain.  Do not drive or perform other dangerous activities while taking the Vicodin.  Please note that each pill of Vicodin contains 325 mg of Tylenol and the above dosage limits apply. Ice: May apply ice to the area over the next 24 hours for 15 minutes at a time to reduce swelling. Elevation: Keep the extremity elevated as often as possible to reduce pain and inflammation. Exercises: Start by performing these exercises a few times a week, increasing the frequency until you are performing them twice daily.  Please take all of your antibiotics until finished!   You may develop abdominal discomfort or diarrhea from the antibiotic.  You may help offset this with probiotics which you can buy or get in yogurt. Do not eat or take the probiotics until 2 hours after your antibiotic.  Follow up: If symptoms are improving, you may follow up with your primary care provider for  any continued management. If symptoms are not improving, you may follow up with the orthopedic specialist.
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
## 2018-03-13 ENCOUNTER — Other Ambulatory Visit: Payer: Self-pay

## 2018-03-13 ENCOUNTER — Encounter (HOSPITAL_COMMUNITY): Payer: Self-pay | Admitting: Emergency Medicine

## 2018-03-13 ENCOUNTER — Emergency Department (HOSPITAL_COMMUNITY)
Admission: EM | Admit: 2018-03-13 | Discharge: 2018-03-13 | Disposition: A | Discharge status: Home / Self Care | Payer: Medicaid Other | Attending: Emergency Medicine | Admitting: Emergency Medicine

## 2018-03-13 DIAGNOSIS — Y999 Unspecified external cause status: Secondary | ICD-10-CM | POA: Insufficient documentation

## 2018-03-13 DIAGNOSIS — Y929 Unspecified place or not applicable: Secondary | ICD-10-CM | POA: Insufficient documentation

## 2018-03-13 DIAGNOSIS — Y93B3 Activity, free weights: Secondary | ICD-10-CM | POA: Insufficient documentation

## 2018-03-13 DIAGNOSIS — X500XXA Overexertion from strenuous movement or load, initial encounter: Secondary | ICD-10-CM | POA: Insufficient documentation

## 2018-03-13 DIAGNOSIS — M25512 Pain in left shoulder: Secondary | ICD-10-CM | POA: Insufficient documentation

## 2018-03-13 DIAGNOSIS — Z79899 Other long term (current) drug therapy: Secondary | ICD-10-CM | POA: Insufficient documentation

## 2018-03-13 DIAGNOSIS — S29012A Strain of muscle and tendon of back wall of thorax, initial encounter: Secondary | ICD-10-CM | POA: Insufficient documentation

## 2018-03-13 MED ORDER — KETOROLAC TROMETHAMINE 60 MG/2ML IM SOLN
30.0000 mg | Freq: Once | INTRAMUSCULAR | Status: DC
  Filled 2018-03-13: qty 2

## 2018-03-13 MED ORDER — NAPROXEN 500 MG PO TABS: 500.0000 mg | ORAL_TABLET | Freq: Two times a day (BID) | ORAL | 0 refills | Status: DC

## 2018-03-13 MED ORDER — HYDROCODONE-ACETAMINOPHEN 5-325 MG PO TABS
1.0000 | ORAL_TABLET | Freq: Once | ORAL | Status: AC
  Administered 2018-03-13: 1 via ORAL
  Filled 2018-03-13: qty 1

## 2018-03-13 NOTE — ED Notes (Signed)
Pt is no longer in hall bed.  Believe pt left prior to d/c instructions.

## 2018-03-13 NOTE — Discharge Instructions (Signed)
Ice and heat alternate, Wear sling for comfort.  Naprosyn for pain and inflammation.  Follow-up with orthopedics if not improving.  Avoid any heavy lifting with left arm.

## 2018-03-13 NOTE — ED Provider Notes (Signed)
[facility] EMERGENCY DEPARTMENT Provider Note   CSN: [id number] Arrival date & time: 03/13/18  2024     History   Chief Complaint Chief Complaint  Patient presents with  . Muscle Pain    HPI Anthony Michael is a 27 y.o. male.  HPI Anthony Michael is a 27 y.o. male presents to emergency department with complaint of back and shoulder pain.  Patient states he was doing a dead lift and states when he left his Tommi Rumps, he felt a pop in his left upper back and shoulder area.  He reports severe pain since then.  Incident occurred several hours ago.  He took ibuprofen and Tylenol with no relief of his symptoms.  He denies any numbness or weakness in his hand.  He denies any other complaints.  He states he is having difficulty moving his shoulder due to pain.  He states he is able to move it but it is painful.  No history of similar injuries in the past.  He states "I have bad arthritis from prior accident everywhere in my body."  Past Medical History:  Diagnosis Date  . ADHD (attention deficit hyperactivity disorder)   . Arthritis     There are no active problems to display for this patient.   Past Surgical History:  Procedure Laterality Date  . TIBIA FRACTURE SURGERY          Home Medications    Prior to Admission medications   Medication Sig Start Date End Date Taking? Authorizing Provider  cyclobenzaprine (FLEXERIL) 10 MG tablet Take 1 tablet (10 mg total) by mouth at bedtime as needed for muscle spasms. 11/10/16   Robinson, [place] N, PA-C  diclofenac sodium (VOLTAREN) 1 % GEL Apply 4 g topically 4 (four) times daily. Apply to your knee as needed for pain and swelling 11/18/15   Antonietta Breach, PA-C  HYDROcodone-acetaminophen (NORCO/VICODIN) 5-325 MG tablet Take 1-2 tablets by mouth every 6 (six) hours as needed for severe pain. 09/08/17   Joy, Shawn C, PA-C  ibuprofen (ADVIL,MOTRIN) 600 MG tablet Take 1 tablet (600 mg total) by mouth every 6 (six) hours as needed.  09/08/17   Joy, Shawn C, PA-C  naproxen (NAPROSYN) 500 MG tablet Take 1 tablet (500 mg total) by mouth 2 (two) times daily. 03/01/16   Charlann Lange, PA-C  oxyCODONE-acetaminophen (PERCOCET/ROXICET) 5-325 MG tablet Take 1 tablet by mouth every 4 (four) hours as needed for severe pain. 01/02/16   Isla Pence, MD    Family History Family History  Problem Relation Age of Onset  . Hypertension Mother     Social History Social History   Tobacco Use  . Smoking status: Never Smoker  . Smokeless tobacco: Never Used  Substance Use Topics  . Alcohol use: Yes    Comment: Once a week.   . Drug use: Yes    Types: Marijuana    Comment: occ, denies in last month 11/17/2015     Allergies   Penicillins and Aspirin   Review of Systems Review of Systems  Constitutional: Negative for chills and fever.  Respiratory: Negative for cough, chest tightness and shortness of breath.   Cardiovascular: Negative for chest pain, palpitations and leg swelling.  Musculoskeletal: Positive for arthralgias, back pain and myalgias. Negative for neck pain and neck stiffness.  Skin: Negative for rash.  Allergic/Immunologic: Negative for immunocompromised state.  Neurological: Negative for dizziness, weakness, light-headedness, numbness and headaches.  All other systems reviewed and are negative.    Physical  Exam Updated Vital Signs BP (!) 153/70 (BP Location: Right Arm)   Pulse 66   Temp 98.6 F (37 C) (Oral)   Resp 15   Ht 5\' 8"  (1.727 m)   Wt 72.6 kg   SpO2 100%   BMI 24.33 kg/m   Physical Exam  Constitutional: He appears well-developed and well-nourished. No distress.  HENT:  Head: Normocephalic and atraumatic.  Eyes: Conjunctivae are normal.  Neck: Neck supple.  Cardiovascular: Normal rate, regular rhythm and normal heart sounds.  Pulmonary/Chest: Effort normal. No respiratory distress. He has no wheezes. He has no rales.  Musculoskeletal: He exhibits no edema.  Tenderness to palpation  over left trapezius and left latissimus dorsi muscle.  Diffuse tenderness around her shoulder.  Full range of motion of the shoulder with pain.  No bruising or swelling.  Distal radial pulses intact.  Neurological: He is alert.  Skin: Skin is warm and dry.  Nursing note and vitals reviewed.    ED Treatments / Results  Labs (all labs ordered are listed, but only abnormal results are displayed) Labs Reviewed - No data to display  EKG None  Radiology No results found.  Procedures Procedures (including critical care time)  Medications Ordered in ED Medications  HYDROcodone-acetaminophen (NORCO/VICODIN) 5-325 MG per tablet 1 tablet (has no administration in time range)  ketorolac (TORADOL) injection 30 mg (has no administration in time range)     Initial Impression / Assessment and Plan / ED Course  I have reviewed the triage vital signs and the nursing notes.  Pertinent labs & imaging results that were available during my care of the patient were reviewed by me and considered in my medical decision making (see chart for details).     Patient with what seems like a muscular pain in his left posterior shoulder and upper back.  Most likely a muscular strain.  There is no obvious swelling or bruising, I do not think a ruptured muscle.  Is neurovascular intact.  Will place in a sling, advised to do ice packs, NSAIDs, follow-up with orthopedic specialist.  I will give him referral.  Vitals:   03/13/18 2033  BP: (!) 153/70  Pulse: 66  Resp: 15  Temp: 98.6 F (37 C)  SpO2: 100%     Final Clinical Impressions(s) / ED Diagnoses   Final diagnoses:  Strain of latissimus dorsi muscle, initial encounter    ED Discharge Orders         Ordered    naproxen (NAPROSYN) 500 MG tablet  2 times daily     03/13/18 2214           Jeannett Senior, PA-C 03/13/18 2220    Virgel Manifold, MD 03/18/18 1439

## 2018-03-13 NOTE — ED Triage Notes (Signed)
Pt states that he was deadlifting and he felt a snapping sensation under his left shoulder blade and is experiencing severe pain on his left side. Pt reports taking ibuprofen and tylenol at home without relief.

## 2018-09-30 ENCOUNTER — Other Ambulatory Visit: Payer: Self-pay

## 2018-09-30 ENCOUNTER — Emergency Department (HOSPITAL_COMMUNITY): Payer: Medicaid Other

## 2018-09-30 ENCOUNTER — Emergency Department (HOSPITAL_COMMUNITY)
Admission: EM | Admit: 2018-09-30 | Discharge: 2018-09-30 | Disposition: A | Discharge status: Home / Self Care | Payer: Medicaid Other | Attending: Emergency Medicine | Admitting: Emergency Medicine

## 2018-09-30 ENCOUNTER — Encounter (HOSPITAL_COMMUNITY): Payer: Self-pay

## 2018-09-30 DIAGNOSIS — Z79899 Other long term (current) drug therapy: Secondary | ICD-10-CM | POA: Insufficient documentation

## 2018-09-30 DIAGNOSIS — X500XXA Overexertion from strenuous movement or load, initial encounter: Secondary | ICD-10-CM | POA: Insufficient documentation

## 2018-09-30 DIAGNOSIS — Y929 Unspecified place or not applicable: Secondary | ICD-10-CM | POA: Insufficient documentation

## 2018-09-30 DIAGNOSIS — D169 Benign neoplasm of bone and articular cartilage, unspecified: Secondary | ICD-10-CM

## 2018-09-30 DIAGNOSIS — Y999 Unspecified external cause status: Secondary | ICD-10-CM | POA: Insufficient documentation

## 2018-09-30 DIAGNOSIS — D1622 Benign neoplasm of long bones of left lower limb: Secondary | ICD-10-CM | POA: Insufficient documentation

## 2018-09-30 DIAGNOSIS — S93402A Sprain of unspecified ligament of left ankle, initial encounter: Secondary | ICD-10-CM | POA: Insufficient documentation

## 2018-09-30 DIAGNOSIS — Y9344 Activity, trampolining: Secondary | ICD-10-CM | POA: Insufficient documentation

## 2018-09-30 MED ORDER — HYDROCODONE-ACETAMINOPHEN 5-325 MG PO TABS
1.0000 | ORAL_TABLET | Freq: Once | ORAL | Status: AC
  Administered 2018-09-30: 1 via ORAL
  Filled 2018-09-30: qty 1

## 2018-09-30 MED ORDER — IBUPROFEN 600 MG PO TABS: 600.0000 mg | ORAL_TABLET | Freq: Four times a day (QID) | ORAL | 0 refills | Status: AC | PRN
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
## 2018-09-30 NOTE — ED Provider Notes (Signed)
[facility] DEPT Provider Note   CSN: [id number] Arrival date & time: 09/30/18  1748    History   Chief Complaint Chief Complaint  Patient presents with  . Ankle Pain    HPI Anthony Michael is a 28 y.o. male.     HPI  Patient is a 28 year old male with a history of ADHD, arthritis, prior to be a surgery presenting for left ankle injury.  Patient reports that he was on a trampoline earlier today when he lost his footing and inverted his ankle.  He reports that he was ambulatory on it ever since, however he has been limping.  He reports that the pain is shooting throughout the foot and is experiencing some "numbness" in the distal toes.  Reports minor swelling of the lateral malleolus.  Denies knee injury.  Denies proximal tibial tenderness.  Denies change in coloration of the foot.  Patient reports taking Tylenol approximately 6 hours ago when the injury occurred.  Past Medical History:  Diagnosis Date  . ADHD (attention deficit hyperactivity disorder)   . Arthritis     There are no active problems to display for this patient.   Past Surgical History:  Procedure Laterality Date  . TIBIA FRACTURE SURGERY          Home Medications    Prior to Admission medications   Medication Sig Start Date End Date Taking? Authorizing Provider  cyclobenzaprine (FLEXERIL) 10 MG tablet Take 1 tablet (10 mg total) by mouth at bedtime as needed for muscle spasms. 11/10/16   Robinson, Martin[place] N, PA-C  diclofenac sodium (VOLTAREN) 1 % GEL Apply 4 g topically 4 (four) times daily. Apply to your knee as needed for pain and swelling 11/18/15   Antonietta Breach, PA-C  HYDROcodone-acetaminophen (NORCO/VICODIN) 5-325 MG tablet Take 1-2 tablets by mouth every 6 (six) hours as needed for severe pain. 09/08/17   Joy, Shawn C, PA-C  ibuprofen (ADVIL,MOTRIN) 600 MG tablet Take 1 tablet (600 mg total) by mouth every 6 (six) hours as needed. 09/08/17   Joy, Shawn C, PA-C  naproxen  (NAPROSYN) 500 MG tablet Take 1 tablet (500 mg total) by mouth 2 (two) times daily. 03/13/18   Kirichenko, Lahoma Rocker, PA-C  oxyCODONE-acetaminophen (PERCOCET/ROXICET) 5-325 MG tablet Take 1 tablet by mouth every 4 (four) hours as needed for severe pain. 01/02/16   Isla Pence, MD    Family History Family History  Problem Relation Age of Onset  . Hypertension Mother     Social History Social History   Tobacco Use  . Smoking status: Never Smoker  . Smokeless tobacco: Never Used  Substance Use Topics  . Alcohol use: Yes    Comment: Once a week.   . Drug use: Yes    Types: Marijuana     Allergies   Penicillins and Aspirin   Review of Systems Review of Systems  Musculoskeletal: Positive for arthralgias and myalgias.  Skin: Negative for wound.  Neurological: Negative for weakness.     Physical Exam Updated Vital Signs BP 113/79 (BP Location: Left Arm)   Pulse 81   Temp 98.5 F (36.9 C) (Oral)   Resp 16   Ht 5\' 8"  (1.727 m)   Wt 72.6 kg   SpO2 100%   BMI 24.33 kg/m   Physical Exam Vitals signs and nursing note reviewed.  Constitutional:      General: He is not in acute distress.    Appearance: He is well-developed. He is not diaphoretic.  Comments: Sitting comfortably in bed.  HENT:     Head: Normocephalic and atraumatic.  Eyes:     General:        Right eye: No discharge.        Left eye: No discharge.     Conjunctiva/sclera: Conjunctivae normal.     Comments: EOMs normal to gross examination.  Neck:     Musculoskeletal: Normal range of motion.  Cardiovascular:     Rate and Rhythm: Normal rate and regular rhythm.     Comments: Intact, 2+ left pedal pulse. Abdominal:     General: There is no distension.  Musculoskeletal:     Comments: Left ankle with tenderness to palpation overlying lateral malleolus. Minor soft tissue swelling over lateral malleolus.  ROM decreased with flexion/extension due to pain. No erythema, ecchymosis, or deformity  appreciated. No break in skin. No pain to fifth metatarsal area or navicular region.  No proximal fibula tenderness.  Achilles intact per Thompson's test. 2+ good pedal pulse and cap refill of toes. Sensation intact to light touch distally.   Skin:    General: Skin is warm and dry.  Neurological:     Mental Status: He is alert.     Comments: Cranial nerves intact to gross observation. Patient moves extremities without difficulty.  Psychiatric:        Behavior: Behavior normal.        Thought Content: Thought content normal.        Judgment: Judgment normal.      ED Treatments / Results  Labs (all labs ordered are listed, but only abnormal results are displayed) Labs Reviewed - No data to display  EKG None  Radiology Dg Ankle Complete Left  Result Date: 09/30/2018 CLINICAL DATA:  Pt states he was at a trampoline park and fell, injuring left ankle. generalized left ankle pain, pt states prior left lower leg injury 2004 EXAM: LEFT ANKLE COMPLETE - 3+ VIEW COMPARISON:  None. FINDINGS: No fracture. There is a partly imaged protrudes from the medial cortex of the fibular diaphysis consistent with an osteo chondroma. No other bone lesions. Ankle joint is normally spaced and aligned. No arthropathic changes. Soft tissues are unremarkable. IMPRESSION: No fracture or ankle joint abnormality. Electronically Signed   By: Lajean Manes M.D.   On: 09/30/2018 18:45    Procedures Procedures (including critical care time)  Medications Ordered in ED Medications  HYDROcodone-acetaminophen (NORCO/VICODIN) 5-325 MG per tablet 1 tablet (has no administration in time range)     Initial Impression / Assessment and Plan / ED Course  I have reviewed the triage vital signs and the nursing notes.  Pertinent labs & imaging results that were available during my care of the patient were reviewed by me and considered in my medical decision making (see chart for details).        Patient well-appearing  and neurovascular intact on my exam in the left ankle.  No unstable ankle injury palpated.  Examination consistent with uncomplicated ankle sprain.  Radiograph negative for fracture.  Radiograph also shows possible osteochondroma.  Patient is notified of this result.  Recommended follow-up with orthopedics for ankle sprain and weightbearing as tolerated as well as RICE therapy.  Return precautions given for any increasing pain, pallor, paresthesias or discoloration.  Patient is in understanding and agrees with the plan of care.  Final Clinical Impressions(s) / ED Diagnoses   Final diagnoses:  Sprain of left ankle, unspecified ligament, initial encounter  Osteochondroma    ED  Discharge Orders         Ordered    ibuprofen (ADVIL,MOTRIN) 600 MG tablet  Every 6 hours PRN     09/30/18 1958           Tamala Julian 09/30/18 2002    [address], [place] 09/30/18 2224

## 2018-09-30 NOTE — Discharge Instructions (Signed)
Please see the information and instructions below regarding your visit.  Your diagnoses today include:  1. Sprain of left ankle, unspecified ligament, initial encounter    Your provider has diagnosed you as suffering from an ankle sprain. Ankle sprain occurs when the ligaments that hold the ankle joint together are stretched or torn. It may take 4 to 6 weeks to heal.  Tests performed today include: An x-ray of your ankle - does NOT show any broken bones  See side panel of your discharge paperwork for testing performed today. Vital signs are listed at the bottom of these instructions.   Medications prescribed:  Take any prescribed medications only as prescribed, and any over the counter medications only as directed on the packaging.  You are prescribed ibuprofen, a non-steroidal anti-inflammatory agent (NSAID) for pain. You may take 600 mg every 6 hours as needed for pain. If still requiring this medication around the clock for acute pain after 10 days, please see your primary healthcare provider.  Women who are pregnant, breastfeeding, or planning on becoming pregnant should not take non-steroidal anti-inflammatories such as Advil and Aleve. Tylenol is a safe over the counter pain reliever in pregnant women.  You may combine this medication with Tylenol, 650 mg every 6 hours, so you are receiving something for pain every 3 hours.  This is not a long-term medication unless under the care and direction of your primary provider. Taking this medication long-term and not under the supervision of a healthcare provider could increase the risk of stomach ulcers, kidney problems, and cardiovascular problems such as high blood pressure.   Home care instructions:  Follow R.I.C.E. Protocol: R - rest your injury  I  - use ice on injury without applying directly to skin C - compress injury with bandage or splint E - elevate the injury as much as possible  For Activity: Wear ankle brace for at least 2  weeks for stabilization of ankle. If prescribed crutches, use crutches with non-weight bearing for the first few days. Then, you may walk on your ankle as the pain allows, or as instructed. Start gradually with weight bearing on the affected ankle. Once you can walk pain free, then try jogging. When you can run forwards, then you can try moving side-to-side. If you cannot walk without crutches in one week, you need a re-check.  Please follow any educational materials contained in this packet.   Follow-up instructions: Please follow-up with your primary care provider or the provided orthopedic (bone specialist) listed in this packet if you continue to have significant pain or trouble walking in 1 week. In this case you may have a severe sprain that requires further care.   Return instructions:  Please return if your toes are numb or tingling, appear gray or blue, are much colder than your other foot, or you have severe pain (also elevate leg and loosen splint or wrap). Please return to the Emergency Department if you experience worsening symptoms.  Please return if you have any other emergent concerns.  Additional Information:   Your vital signs today were: BP 113/79 (BP Location: Left Arm)    Pulse 81    Temp 98.5 F (36.9 C) (Oral)    Resp 16    Ht 5\' 8"  (1.727 m)    Wt 72.6 kg    SpO2 100%    BMI 24.33 kg/m  If your blood pressure (BP) was elevated on multiple readings during this visit above 130 for the top number or  above 80 for the bottom number, please have this repeated by your primary care provider within one month. --------------  Thank you for allowing [place] to participate in your care today.

## 2021-11-09 ENCOUNTER — Emergency Department (HOSPITAL_COMMUNITY)
Admission: EM | Admit: 2021-11-09 | Discharge: 2021-11-09 | Disposition: A | Discharge status: Home / Self Care | Payer: Medicaid Other | Attending: Emergency Medicine | Admitting: Emergency Medicine

## 2021-11-09 ENCOUNTER — Encounter (HOSPITAL_COMMUNITY): Payer: Self-pay

## 2021-11-09 ENCOUNTER — Other Ambulatory Visit: Payer: Self-pay

## 2021-11-09 DIAGNOSIS — K0889 Other specified disorders of teeth and supporting structures: Secondary | ICD-10-CM

## 2021-11-09 MED ORDER — IBUPROFEN 800 MG PO TABS: 800.0000 mg | ORAL_TABLET | Freq: Three times a day (TID) | ORAL | 0 refills | Status: AC

## 2021-11-09 MED ORDER — CLINDAMYCIN HCL 150 MG PO CAPS: 300.0000 mg | ORAL_CAPSULE | Freq: Three times a day (TID) | ORAL | 0 refills | Status: DC

## 2021-11-09 MED ORDER — IBUPROFEN 800 MG PO TABS: 800.0000 mg | ORAL_TABLET | Freq: Three times a day (TID) | ORAL | 0 refills | Status: DC

## 2021-11-09 MED ORDER — CLINDAMYCIN HCL 150 MG PO CAPS: 300.0000 mg | ORAL_CAPSULE | Freq: Three times a day (TID) | ORAL | 0 refills | Status: AC

## 2021-11-09 NOTE — Discharge Instructions (Addendum)
Please pick up medications and take as prescribed ? ?Attached is a list of dental resources in the area. You will need to call to see someone/schedule an appointment. Ultimately you likely will need the tooth extracted.  ? ?Return to the ED for any new/worsening symptoms ?

## 2021-11-09 NOTE — ED Provider Notes (Signed)
?[facility] ?Provider Note ? ? ?CSN: [id number] ?Arrival date & time: 11/09/21  2050 ? ?  ? ?History ? ?Chief Complaint  ?Patient presents with  ? Dental Pain  ? ? ?Anthony Michael is a 31 y.o. male who presents to the ED today with complaint of gradual onset, constant, worsening, right lower dental pain for the past several months.  Reports over the past couple days pain has worsened.  He has been taking multiple over-the-counter medications without much relief.  He does not have a dentist in the area to follow-up with.  Denies any fevers or chills.  No facial swelling.  ? ?The history is provided by the patient and medical records.  ? ?  ? ?Home Medications ?Prior to Admission medications   ?Medication Sig Start Date End Date Taking? Authorizing Provider  ?clindamycin (CLEOCIN) 150 MG capsule Take 2 capsules (300 mg total) by mouth 3 (three) times daily for 7 days. 11/09/21 11/16/21 Yes Shahrukh Pasch, PA-C  ?ibuprofen (ADVIL) 800 MG tablet Take 1 tablet (800 mg total) by mouth 3 (three) times daily. 11/09/21  Yes Doria Fern, PA-C  ?cyclobenzaprine (FLEXERIL) 10 MG tablet Take 1 tablet (10 mg total) by mouth at bedtime as needed for muscle spasms. 11/10/16   Robinson, Martin[place] N, PA-C  ?diclofenac sodium (VOLTAREN) 1 % GEL Apply 4 g topically 4 (four) times daily. Apply to your knee as needed for pain and swelling 11/18/15   Antonietta Breach, PA-C  ?HYDROcodone-acetaminophen (NORCO/VICODIN) 5-325 MG tablet Take 1-2 tablets by mouth every 6 (six) hours as needed for severe pain. 09/08/17   Joy, Shawn C, PA-C  ?ibuprofen (ADVIL,MOTRIN) 600 MG tablet Take 1 tablet (600 mg total) by mouth every 6 (six) hours as needed. 09/30/18   Langston Masker B, PA-C  ?naproxen (NAPROSYN) 500 MG tablet Take 1 tablet (500 mg total) by mouth 2 (two) times daily. 03/13/18   Kirichenko, Lahoma Rocker, PA-C  ?oxyCODONE-acetaminophen (PERCOCET/ROXICET) 5-325 MG tablet Take 1 tablet by mouth every 4 (four) hours as  needed for severe pain. 01/02/16   Isla Pence, MD  ?   ? ?Allergies    ?Penicillins and Aspirin   ? ?Review of Systems   ?Review of Systems  ?Constitutional:  Negative for chills and fever.  ?HENT:  Positive for dental problem. Negative for facial swelling.   ?All other systems reviewed and are negative. ? ?Physical Exam ?Updated Vital Signs ?BP 135/75 (BP Location: Right Arm)   Pulse 70   Temp 98.3 ?F (36.8 ?C) (Oral)   Resp 18   SpO2 97%  ?Physical Exam ?Vitals and nursing note reviewed.  ?Constitutional:   ?   Appearance: He is not ill-appearing.  ?HENT:  ?   Head: Normocephalic and atraumatic.  ?   Mouth/Throat:  ?   Comments: Nose clear.  ?R lower tooth #32 decayed and fractured with caries with TTP, with minimal surrounding gingival swelling and erythema, no definite abscess, no evidence of ludwig's.  ?Oropharynx clear and moist, without uvular swelling or deviation, no trismus or drooling, no tonsillar swelling or erythema, no exudates.   ?Eyes:  ?   Conjunctiva/sclera: Conjunctivae normal.  ?Cardiovascular:  ?   Rate and Rhythm: Normal rate and regular rhythm.  ?Pulmonary:  ?   Effort: Pulmonary effort is normal.  ?   Breath sounds: Normal breath sounds.  ?Skin: ?   General: Skin is warm and dry.  ?   Coloration: Skin is not jaundiced.  ?Neurological:  ?  Mental Status: He is alert.  ? ? ?ED Results / Procedures / Treatments   ?Labs ?(all labs ordered are listed, but only abnormal results are displayed) ?Labs Reviewed - No data to display ? ?EKG ?None ? ?Radiology ?No results found. ? ?Procedures ?Procedures  ? ? ?Medications Ordered in ED ?Medications - No data to display ? ?ED Course/ Medical Decision Making/ A&P ?  ?                        ?Medical Decision Making ?31 year old male who presents to the ED today with complaint of right lower dental pain for several months, worsening recently.  Reports sharp shooting pain.  Does not have dentist to follow-up with.  On arrival to the ED vitals are  stable.  Patient appears to be in no acute distress.  He is noted to have fractured tooth #32 with surrounding gingival erythema.  No definitive abscess to be drained at this time and no concern for Ludwick's angina.  Suspect that he does have nerve exposure with description of sharp shooting type pains into his face.  He will ultimately need to follow-up with a dentist for further evaluation and likely extraction of the tooth.  We will plan to discharge home with antibiotics at this time.  He does have allergy to penicillins.  Will discharge with clinda.  Patient also requesting 100 mg ibuprofen prescription, have sent this to his pharmacy as well.  He is stable for discharge.  He does not acquire any labs or imaging today. ? ?Problems Addressed: ?Pain, dental: acute illness or injury ? ? ? ? ? ? ? ? ? ?Final Clinical Impression(s) / ED Diagnoses ?Final diagnoses:  ?Pain, dental  ? ? ?Rx / DC Orders ?ED Discharge Orders   ? ?      Ordered  ?  clindamycin (CLEOCIN) 150 MG capsule  3 times daily       ? 11/09/21 2124  ?  ibuprofen (ADVIL) 800 MG tablet  3 times daily       ? 11/09/21 2124  ? ?  ?  ? ?  ? ? ? ?Discharge Instructions   ? ?  ?Please pick up medications and take as prescribed ? ?Attached is a list of dental resources in the area. You will need to call to see someone/schedule an appointment. Ultimately you likely will need the tooth extracted.  ? ?Return to the ED for any new/worsening symptoms ? ? ? ? ?  ?Eustaquio Maize, PA-C ?11/09/21 2127 ? ?  ?Carmin Muskrat, MD ?11/09/21 2323 ? ?

## 2022-10-23 ENCOUNTER — Emergency Department (HOSPITAL_COMMUNITY)
Admission: EM | Admit: 2022-10-23 | Discharge: 2022-10-23 | Disposition: A | Discharge status: Home / Self Care | Payer: Medicaid Other | Attending: Emergency Medicine | Admitting: Emergency Medicine

## 2022-10-23 ENCOUNTER — Other Ambulatory Visit: Payer: Self-pay

## 2022-10-23 ENCOUNTER — Encounter (HOSPITAL_COMMUNITY): Payer: Self-pay | Admitting: Pharmacy Technician

## 2022-10-23 DIAGNOSIS — K0889 Other specified disorders of teeth and supporting structures: Secondary | ICD-10-CM

## 2022-10-23 MED ORDER — CLINDAMYCIN HCL 150 MG PO CAPS: 300.0000 mg | ORAL_CAPSULE | Freq: Three times a day (TID) | ORAL | 0 refills | Status: AC

## 2022-10-23 MED ORDER — CLINDAMYCIN HCL 300 MG PO CAPS
300.0000 mg | ORAL_CAPSULE | Freq: Once | ORAL | Status: AC
  Administered 2022-10-23: 300 mg via ORAL
  Filled 2022-10-23: qty 1

## 2022-10-23 MED ORDER — HYDROCODONE-ACETAMINOPHEN 5-325 MG PO TABS
1.0000 | ORAL_TABLET | Freq: Once | ORAL | Status: AC
  Administered 2022-10-23: 1 via ORAL
  Filled 2022-10-23: qty 1

## 2022-10-23 MED ORDER — NAPROXEN 375 MG PO TABS: 375.0000 mg | ORAL_TABLET | Freq: Two times a day (BID) | ORAL | 0 refills | Status: AC

## 2022-10-23 NOTE — Discharge Instructions (Signed)
Take the prescribed medication as directed. Follow-up with dentist as soon as you can-- I would call Dr. Haig Prophet today to try and get an appt. Return to the ED for new or worsening symptoms.

## 2022-10-23 NOTE — ED Provider Notes (Signed)
[facility] Provider Note   CSN: [id number] Arrival date & time: 10/23/22  0820     History  Chief Complaint  Patient presents with   Dental Pain    Anthony Michael is a 32 y.o. male.  The history is provided by the patient and medical records.  Dental Pain  32 year old male presenting to the ED with diffuse dental pain, more so right lower molars.  States several teeth are decayed and broken, small pieces have been breaking off over time but got much worse yesterday.  States he is starting to feel some swelling along the right side of his mouth.  He denies any difficulty swallowing.  He has been taking Tylenol and Motrin without much relief.  He has not had any fevers.  He is not currently established with a dentist.  Home Medications Prior to Admission medications   Medication Sig Start Date End Date Taking? Authorizing Provider  clindamycin (CLEOCIN) 150 MG capsule Take 2 capsules (300 mg total) by mouth 3 (three) times daily. May dispense as 150mg  capsules 10/23/22  Yes Baird Cancer, Vilinda Blanks, PA-C  naproxen (NAPROSYN) 375 MG tablet Take 1 tablet (375 mg total) by mouth 2 (two) times daily. 10/23/22  Yes Larene Pickett, PA-C  cyclobenzaprine (FLEXERIL) 10 MG tablet Take 1 tablet (10 mg total) by mouth at bedtime as needed for muscle spasms. 11/10/16   Robinson, Martin[place] N, PA-C  diclofenac sodium (VOLTAREN) 1 % GEL Apply 4 g topically 4 (four) times daily. Apply to your knee as needed for pain and swelling 11/18/15   Antonietta Breach, PA-C  HYDROcodone-acetaminophen (NORCO/VICODIN) 5-325 MG tablet Take 1-2 tablets by mouth every 6 (six) hours as needed for severe pain. 09/08/17   Joy, Shawn C, PA-C  ibuprofen (ADVIL) 800 MG tablet Take 1 tablet (800 mg total) by mouth 3 (three) times daily. 11/09/21   Alroy Bailiff, Margaux, PA-C  ibuprofen (ADVIL,MOTRIN) 600 MG tablet Take 1 tablet (600 mg total) by mouth every 6 (six) hours as needed. 09/30/18   Langston Masker B,  PA-C  oxyCODONE-acetaminophen (PERCOCET/ROXICET) 5-325 MG tablet Take 1 tablet by mouth every 4 (four) hours as needed for severe pain. 01/02/16   Isla Pence, MD      Allergies    Penicillins and Aspirin    Review of Systems   Review of Systems  HENT:  Positive for dental problem.   All other systems reviewed and are negative.   Physical Exam Updated Vital Signs BP (!) 144/87 (BP Location: Left Arm)   Pulse 65   Temp 98.3 F (36.8 C) (Oral)   Resp 16   SpO2 98%   Physical Exam Vitals and nursing note reviewed.  Constitutional:      Appearance: He is well-developed.  HENT:     Head: Normocephalic and atraumatic.     Comments: Teeth largely in fair dentition, several molars are broken and decayed, particularly right lower 2nd molar which has some gingival swelling but no discrete abscess, handling secretions appropriately, no trismus, no facial or neck swelling, normal phonation without stridor Eyes:     Conjunctiva/sclera: Conjunctivae normal.     Pupils: Pupils are equal, round, and reactive to light.  Cardiovascular:     Rate and Rhythm: Normal rate and regular rhythm.     Heart sounds: Normal heart sounds.  Pulmonary:     Effort: Pulmonary effort is normal.     Breath sounds: Normal breath sounds.  Abdominal:  General: Bowel sounds are normal.     Palpations: Abdomen is soft.  Musculoskeletal:        General: Normal range of motion.     Cervical back: Normal range of motion.  Skin:    General: Skin is warm and dry.  Neurological:     Mental Status: He is alert and oriented to person, place, and time.     ED Results / Procedures / Treatments   Labs (all labs ordered are listed, but only abnormal results are displayed) Labs Reviewed - No data to display  EKG None  Radiology No results found.  Procedures Procedures    Medications Ordered in ED Medications  clindamycin (CLEOCIN) capsule 300 mg (has no administration in time range)   HYDROcodone-acetaminophen (NORCO/VICODIN) 5-325 MG per tablet 1 tablet (has no administration in time range)    ED Course/ Medical Decision Making/ A&P                             Medical Decision Making Risk Prescription drug management.   32 year old male here with diffuse dental pain, particularly right lower molar.  Does have fairly significant dental decay and multiple broken molars.  Does have some right lower gingival irritation and swelling without discrete abscess.  No facial or neck swelling, handling secretions well, no stridor.  Symptoms not clinically concerning for Ludwig's angina.  Will start on antibiotics and referred to dentist for follow-up.  Work note given.  Can return here for any new or acute changes.  Final Clinical Impression(s) / ED Diagnoses Final diagnoses:  Pain, dental    Rx / DC Orders ED Discharge Orders          Ordered    clindamycin (CLEOCIN) 150 MG capsule  3 times daily        10/23/22 0918    naproxen (NAPROSYN) 375 MG tablet  2 times daily        10/23/22 0918              Garlon Hatchet, PA-C 10/23/22 6153    Derwood Kaplan, MD 10/24/22 [phone]

## 2022-10-23 NOTE — ED Triage Notes (Signed)
Pt here with reports of dental pain to his whole mouth. Pt states several of his teeth have broken off and yesterday the pain became unbearable. Taking otc meds without relief.
